# Patient Record
Sex: Female | Born: 1965 | Race: White | Hispanic: No | Smoking: Former smoker
Health system: Southern US, Community
[De-identification: ages and names within clinical notes are randomized; demographics above are authoritative.]

## PROBLEM LIST (undated history)

## (undated) DIAGNOSIS — Z8489 Family history of other specified conditions: Secondary | ICD-10-CM

## (undated) DIAGNOSIS — Z98811 Dental restoration status: Secondary | ICD-10-CM

## (undated) DIAGNOSIS — Z87442 Personal history of urinary calculi: Secondary | ICD-10-CM

## (undated) DIAGNOSIS — G43909 Migraine, unspecified, not intractable, without status migrainosus: Secondary | ICD-10-CM

## (undated) DIAGNOSIS — Z87820 Personal history of traumatic brain injury: Secondary | ICD-10-CM

## (undated) DIAGNOSIS — D171 Benign lipomatous neoplasm of skin and subcutaneous tissue of trunk: Secondary | ICD-10-CM

## (undated) DIAGNOSIS — F32A Depression, unspecified: Secondary | ICD-10-CM

## (undated) DIAGNOSIS — F329 Major depressive disorder, single episode, unspecified: Secondary | ICD-10-CM

## (undated) DIAGNOSIS — R011 Cardiac murmur, unspecified: Secondary | ICD-10-CM

---

## 1987-01-20 HISTORY — PX: WISDOM TOOTH EXTRACTION: SHX21

## 2001-03-15 ENCOUNTER — Other Ambulatory Visit: Admission: RE | Admit: 2001-03-15 | Discharge: 2001-03-15 | Payer: Self-pay | Admitting: *Deleted

## 2002-07-20 ENCOUNTER — Other Ambulatory Visit: Admission: RE | Admit: 2002-07-20 | Discharge: 2002-07-20 | Payer: Self-pay | Admitting: Family Medicine

## 2002-09-19 ENCOUNTER — Encounter: Payer: Self-pay | Admitting: Emergency Medicine

## 2002-09-19 ENCOUNTER — Emergency Department (HOSPITAL_COMMUNITY): Admission: EM | Admit: 2002-09-19 | Discharge: 2002-09-19 | Payer: Self-pay | Admitting: Emergency Medicine

## 2003-08-24 ENCOUNTER — Other Ambulatory Visit: Admission: RE | Admit: 2003-08-24 | Discharge: 2003-08-24 | Payer: Self-pay | Admitting: Family Medicine

## 2004-09-18 ENCOUNTER — Other Ambulatory Visit: Admission: RE | Admit: 2004-09-18 | Discharge: 2004-09-18 | Payer: Self-pay | Admitting: Family Medicine

## 2005-10-02 ENCOUNTER — Ambulatory Visit: Payer: Self-pay | Admitting: Sports Medicine

## 2005-11-11 ENCOUNTER — Other Ambulatory Visit: Admission: RE | Admit: 2005-11-11 | Discharge: 2005-11-11 | Payer: Self-pay | Admitting: Family Medicine

## 2006-08-17 ENCOUNTER — Ambulatory Visit: Payer: Self-pay | Admitting: Family Medicine

## 2006-08-17 DIAGNOSIS — M79609 Pain in unspecified limb: Secondary | ICD-10-CM | POA: Insufficient documentation

## 2007-01-28 ENCOUNTER — Other Ambulatory Visit: Admission: RE | Admit: 2007-01-28 | Discharge: 2007-01-28 | Payer: Self-pay | Admitting: Family Medicine

## 2008-01-30 ENCOUNTER — Other Ambulatory Visit: Admission: RE | Admit: 2008-01-30 | Discharge: 2008-01-30 | Payer: Self-pay | Admitting: Family Medicine

## 2009-05-13 ENCOUNTER — Inpatient Hospital Stay (HOSPITAL_COMMUNITY): Admission: AD | Admit: 2009-05-13 | Discharge: 2009-05-16 | Payer: Self-pay | Admitting: Obstetrics and Gynecology

## 2009-05-22 ENCOUNTER — Ambulatory Visit: Admission: RE | Admit: 2009-05-22 | Discharge: 2009-05-22 | Payer: Self-pay | Admitting: Obstetrics and Gynecology

## 2009-05-27 ENCOUNTER — Ambulatory Visit: Admission: RE | Admit: 2009-05-27 | Discharge: 2009-05-27 | Payer: Self-pay | Admitting: Obstetrics and Gynecology

## 2009-05-30 ENCOUNTER — Ambulatory Visit: Admission: RE | Admit: 2009-05-30 | Discharge: 2009-05-30 | Payer: Self-pay | Admitting: Obstetrics and Gynecology

## 2009-10-08 ENCOUNTER — Other Ambulatory Visit: Admission: RE | Admit: 2009-10-08 | Discharge: 2009-10-08 | Payer: Self-pay | Admitting: Family Medicine

## 2009-10-11 ENCOUNTER — Ambulatory Visit (HOSPITAL_COMMUNITY): Admission: RE | Admit: 2009-10-11 | Discharge: 2009-10-11 | Payer: Self-pay | Admitting: Family Medicine

## 2010-02-24 ENCOUNTER — Emergency Department (HOSPITAL_COMMUNITY): Payer: 59

## 2010-02-24 ENCOUNTER — Inpatient Hospital Stay (HOSPITAL_COMMUNITY)
Admission: EM | Admit: 2010-02-24 | Discharge: 2010-02-28 | DRG: 957 | Disposition: A | Discharge status: Home Health | Payer: 59 | Attending: General Surgery | Admitting: General Surgery

## 2010-02-24 ENCOUNTER — Inpatient Hospital Stay (HOSPITAL_COMMUNITY): Payer: 59

## 2010-02-24 DIAGNOSIS — S25109A Unspecified injury of unspecified innominate or subclavian artery, initial encounter: Secondary | ICD-10-CM | POA: Diagnosis present

## 2010-02-24 DIAGNOSIS — S42023A Displaced fracture of shaft of unspecified clavicle, initial encounter for closed fracture: Secondary | ICD-10-CM | POA: Diagnosis present

## 2010-02-24 DIAGNOSIS — Z87820 Personal history of traumatic brain injury: Secondary | ICD-10-CM

## 2010-02-24 DIAGNOSIS — S06339A Contusion and laceration of cerebrum, unspecified, with loss of consciousness of unspecified duration, initial encounter: Principal | ICD-10-CM | POA: Diagnosis present

## 2010-02-24 DIAGNOSIS — W19XXXA Unspecified fall, initial encounter: Secondary | ICD-10-CM | POA: Diagnosis present

## 2010-02-24 DIAGNOSIS — S2239XA Fracture of one rib, unspecified side, initial encounter for closed fracture: Secondary | ICD-10-CM | POA: Diagnosis present

## 2010-02-24 DIAGNOSIS — D62 Acute posthemorrhagic anemia: Secondary | ICD-10-CM | POA: Diagnosis not present

## 2010-02-24 DIAGNOSIS — S0100XA Unspecified open wound of scalp, initial encounter: Secondary | ICD-10-CM | POA: Diagnosis present

## 2010-02-24 DIAGNOSIS — J9600 Acute respiratory failure, unspecified whether with hypoxia or hypercapnia: Secondary | ICD-10-CM | POA: Diagnosis present

## 2010-02-24 HISTORY — PX: ORIF CLAVICLE FRACTURE: SUR924

## 2010-02-24 HISTORY — PX: LACERATION REPAIR: SHX5168

## 2010-02-24 HISTORY — DX: Personal history of traumatic brain injury: Z87.820

## 2010-02-24 LAB — POCT I-STAT 7, (LYTES, BLD GAS, ICA,H+H)
Calcium, Ion: 0.96 mmol/L — ABNORMAL LOW (ref 1.12–1.32)
HCT: 32 % — ABNORMAL LOW (ref 36.0–46.0)
O2 Saturation: 100 %
Patient temperature: 35.9
pCO2 arterial: 45.8 mmHg — ABNORMAL HIGH (ref 35.0–45.0)
pO2, Arterial: 500 mmHg — ABNORMAL HIGH (ref 80.0–100.0)

## 2010-02-24 LAB — BASIC METABOLIC PANEL
BUN: 7 mg/dL (ref 6–23)
CO2: 21 mEq/L (ref 19–32)
Chloride: 107 mEq/L (ref 96–112)
GFR calc non Af Amer: >60 mL/min (ref >60)
Glucose, Bld: 156 mg/dL — ABNORMAL HIGH (ref 70–99)
Potassium: 3.4 mEq/L — ABNORMAL LOW (ref 3.5–5.1)
Sodium: 138 mEq/L (ref 135–145)

## 2010-02-24 LAB — CBC
Hemoglobin: 13.4 g/dL (ref 12.0–15.0)
MCH: 29.2 pg (ref 26.0–34.0)
MCHC: 33.4 g/dL (ref 30.0–36.0)
MCV: 87.4 fL (ref 78.0–100.0)
MCV: 85.2 fL (ref 78.0–100.0)
Platelets: 293 10*3/uL (ref 150–400)
RBC: 4.31 MIL/uL (ref 3.87–5.11)
RDW: 13 % (ref 11.5–15.5)
WBC: 26 10*3/uL — ABNORMAL HIGH (ref 4.0–10.5)

## 2010-02-24 LAB — COMPREHENSIVE METABOLIC PANEL
BUN: 8 mg/dL (ref 6–23)
CO2: 18 meq/L — ABNORMAL LOW (ref 19–32)
Calcium: 8.5 mg/dL (ref 8.4–10.5)
GFR calc non Af Amer: >60 mL/min (ref >60)
Glucose, Bld: 296 mg/dL — ABNORMAL HIGH (ref 70–99)
Total Protein: 6.7 g/dL (ref 6.0–8.3)

## 2010-02-24 LAB — BLOOD GAS, ARTERIAL
Acid-base deficit: 4 mmol/L — ABNORMAL HIGH (ref 0.0–2.0)
Bicarbonate: 21.5 mEq/L (ref 20.0–24.0)
FIO2: 0.5 %
O2 Saturation: 99.2 %
PEEP: 5 cmH2O
Patient temperature: 96.8
TCO2: 23 mmol/L (ref 0–100)
pO2, Arterial: 209 mmHg — ABNORMAL HIGH (ref 80.0–100.0)

## 2010-02-24 LAB — URINALYSIS, ROUTINE W REFLEX MICROSCOPIC
Ketones, ur: NEGATIVE mg/dL
Leukocytes, UA: NEGATIVE
Nitrite: NEGATIVE
Protein, ur: 30 mg/dL — AB
Urine Glucose, Fasting: >1000 mg/dL — AB
pH: 5 (ref 5.0–8.0)

## 2010-02-24 LAB — RAPID URINE DRUG SCREEN, HOSP PERFORMED
Benzodiazepines: POSITIVE — AB
Cocaine: NOT DETECTED
Tetrahydrocannabinol: NOT DETECTED

## 2010-02-24 LAB — ABO/RH: ABO/RH(D): A POS

## 2010-02-24 LAB — PROTIME-INR
INR: 1.02 (ref 0.00–1.49)
Prothrombin Time: 13.6 s (ref 11.6–15.2)

## 2010-02-24 LAB — URINE MICROSCOPIC-ADD ON

## 2010-02-24 LAB — APTT: aPTT: 26 s (ref 24–37)

## 2010-02-24 LAB — POCT I-STAT 4, (NA,K, GLUC, HGB,HCT): Glucose, Bld: 104 mg/dL — ABNORMAL HIGH (ref 70–99)

## 2010-02-24 MED ORDER — IOHEXOL 350 MG/ML SOLN
100.0000 mL | Freq: Once | INTRAVENOUS | Status: AC | PRN
  Administered 2010-02-24: 75 mL via INTRAVENOUS

## 2010-02-24 MED ORDER — IOHEXOL 300 MG/ML  SOLN
75.0000 mL | Freq: Once | INTRAMUSCULAR | Status: AC | PRN
  Administered 2010-02-24: 75 mL via INTRAVENOUS

## 2010-02-25 ENCOUNTER — Inpatient Hospital Stay (HOSPITAL_COMMUNITY): Payer: 59

## 2010-02-25 LAB — POCT I-STAT, CHEM 8
Chloride: 109 meq/L (ref 96–112)
Creatinine, Ser: 0.9 mg/dL (ref 0.4–1.2)
Glucose, Bld: 295 mg/dL — ABNORMAL HIGH (ref 70–99)
Potassium: 3.1 meq/L — ABNORMAL LOW (ref 3.5–5.1)

## 2010-02-25 LAB — GLUCOSE, CAPILLARY
Glucose-Capillary: 290 mg/dL — ABNORMAL HIGH (ref 70–99)
Glucose-Capillary: 96 mg/dL (ref 70–99)

## 2010-02-25 LAB — C-PEPTIDE: C-Peptide: 1.56 ng/mL (ref 0.80–3.90)

## 2010-02-25 LAB — BASIC METABOLIC PANEL
BUN: 5 mg/dL — ABNORMAL LOW (ref 6–23)
CO2: 24 mEq/L (ref 19–32)
Chloride: 111 mEq/L (ref 96–112)
Creatinine, Ser: 0.7 mg/dL (ref 0.4–1.2)
Glucose, Bld: 110 mg/dL — ABNORMAL HIGH (ref 70–99)

## 2010-02-25 LAB — TYPE AND SCREEN
ABO/RH(D): A POS
Unit division: 0
Unit division: 0
Unit division: 0

## 2010-02-25 LAB — CBC
HCT: 30.7 % — ABNORMAL LOW (ref 36.0–46.0)
Hemoglobin: 10.4 g/dL — ABNORMAL LOW (ref 12.0–15.0)
MCH: 28.8 pg (ref 26.0–34.0)
MCV: 85 fL (ref 78.0–100.0)
RBC: 3.61 MIL/uL — ABNORMAL LOW (ref 3.87–5.11)

## 2010-02-26 ENCOUNTER — Inpatient Hospital Stay (HOSPITAL_COMMUNITY): Payer: 59

## 2010-02-26 LAB — GLUCOSE, CAPILLARY: Glucose-Capillary: 104 mg/dL — ABNORMAL HIGH (ref 70–99)

## 2010-02-26 LAB — CBC
HCT: 29.4 % — ABNORMAL LOW (ref 36.0–46.0)
Hemoglobin: 9.7 g/dL — ABNORMAL LOW (ref 12.0–15.0)
MCV: 87 fL (ref 78.0–100.0)
RDW: 14.1 % (ref 11.5–15.5)
WBC: 8.8 10*3/uL (ref 4.0–10.5)

## 2010-02-26 LAB — BASIC METABOLIC PANEL
BUN: 3 mg/dL — ABNORMAL LOW (ref 6–23)
Chloride: 112 mEq/L (ref 96–112)
Glucose, Bld: 98 mg/dL (ref 70–99)
Potassium: 4 mEq/L (ref 3.5–5.1)
Sodium: 139 mEq/L (ref 135–145)

## 2010-02-27 LAB — GLUCOSE, CAPILLARY: Glucose-Capillary: 101 mg/dL — ABNORMAL HIGH (ref 70–99)

## 2010-03-03 ENCOUNTER — Ambulatory Visit: Payer: 59 | Attending: Family Medicine

## 2010-03-03 DIAGNOSIS — M25519 Pain in unspecified shoulder: Secondary | ICD-10-CM | POA: Insufficient documentation

## 2010-03-03 DIAGNOSIS — S069XAS Unspecified intracranial injury with loss of consciousness status unknown, sequela: Secondary | ICD-10-CM | POA: Insufficient documentation

## 2010-03-03 DIAGNOSIS — S069X9S Unspecified intracranial injury with loss of consciousness of unspecified duration, sequela: Secondary | ICD-10-CM | POA: Insufficient documentation

## 2010-03-03 DIAGNOSIS — M6281 Muscle weakness (generalized): Secondary | ICD-10-CM | POA: Insufficient documentation

## 2010-03-03 DIAGNOSIS — W19XXXS Unspecified fall, sequela: Secondary | ICD-10-CM | POA: Insufficient documentation

## 2010-03-03 DIAGNOSIS — IMO0001 Reserved for inherently not codable concepts without codable children: Secondary | ICD-10-CM | POA: Insufficient documentation

## 2010-03-03 DIAGNOSIS — R4189 Other symptoms and signs involving cognitive functions and awareness: Secondary | ICD-10-CM | POA: Insufficient documentation

## 2010-03-03 NOTE — Op Note (Signed)
NAMEMarland Williamson  AVALIE, OCONNOR NO.:  [id number]  MEDICAL RECORD NO.:  [id number]           PATIENT TYPE:  I  LOCATION:  3106                         FACILITY:  [facility]  PHYSICIAN:  Vania Rea. Shriyans Kuenzi, M.D.  DATE OF BIRTH:  27-Apr-1965  DATE OF PROCEDURE:  02/24/2010 DATE OF DISCHARGE:                              OPERATIVE REPORT   PREOPERATIVE DIAGNOSIS:  Displaced right clavicle fracture with associated subclavian artery laceration.  POSTOPERATIVE DIAGNOSIS:  Displaced right clavicle fracture with associated subclavian artery laceration.  PROCEDURE:  Open reduction and internal fixation of displaced right clavicle fracture.  SURGEON:  Vania Rea. Zelma Snead, MD  ASSISTANT:  Lucita Lora. Shuford, PAC  ANESTHESIA:  General endotracheal.  ESTIMATED BLOOD LOSS:  During this portion of the procedure approximately 150 mL.  DRAINS:  Hemovac x1.  HISTORY:  Tamara Williamson is a 45 year old female who reportedly fell from a ground level earlier today and sustained a severe right clavicle fracture with an associated closed head injury and apparently had some changes in mental status as well as hemodynamic instability.  On further evaluation in the hospital, she was found to have significant bleeding in the right supraclavicular region with the subsequent angiogram showed an evidence for subclavian artery injury which appeared to have resulted from the displacement of her clavicle fracture at the time of the original injury.  She was brought emergently to the operating room by the Vascular Surgery Service for treatment of the subclavian artery injury and we were consulted intraoperatively for the management of the associated clavicle fracture.  Preoperatively, I had reviewed the x-rays and discussed with Dr. Tawanna Cooler Early about early management options.  Fracture pattern showed severe comminution with instability and given the degree of displacement that had occurred to allow for the  fracture and to cause a laceration of the subclavian artery, elected to proceed with open reduction and internal fixation.  PROCEDURE IN DETAIL:  An exposure had been made to the supraclavicular region by Dr. Arbie Cookey for control of the bleeding and repair of the subclavian artery.  Through this wound, we extended it laterally to allow exposure of this on the clavicle and using combination of blunt and sharp dissection, we were able to expose the clavicle over its dorsal aspect from the [facility] joint laterally to the [facility] joint medially which was necessary due to the very extensive nature of comminution and obliquity of the fracture.  The fracture sites were then exposed and under direct visualization, the major fragments were reapproximated and held with bone holding clamps.  We then took a 10-hole Acumed clavicle plate and applied this dorsally and this did show proper position and alignment with good coverage across the fracture with appropriate cortices medially and laterally and so this was selected as the plate to be used for stabilization.  We began by placing a nonlocking screw at the medial and lateral aspects of clavicle and then used fluoroscopic imaging to confirm that we liked the overall alignment, length, and position.  We then placed locking screws through the medial and lateral aspects of the plate, banding it to the area of comminution, obtaining good bony purchase.  There was such comminution over the midportion of the fracture that screws were not possible, so we basically bridge plate it across the midportion of the clavicle which had a longitudinal split between the anterior and posterior halves.  We did obtain good purchase with the screws in the medial and lateral aspect of the clavicle and this constituted the bulk of these fixation and stability.  Our final fluoroscopic images and 2 view showed the plate to be in good position and good alignment at the fracture site.  At  this point, we placed a Hemovac drain into the depth of the wound medially at the base of the neck and brought this out through the superior skin flap.  We then reapproximated the deep muscular and soft tissue planes with interrupted figure-of-eight 0 Vicryl sutures, 2-0 Vicryl through the platysma and subcu and intracuticular 3-0 Monocryl for the skin followed by Steri- Strips.  Dry dressing was applied.  Right arm was placed in a sling and at this point, the patient was then transferred back to the Neurosurgical Intensive Care Unit in stable condition.     Vania Rea. Geroldine Esquivias, M.D.     KMS/MEDQ  D:  02/24/2010  T:  02/25/2010  Job:  [id number]  Electronically Signed by Francena Hanly M.D. on 03/03/2010 12:05:02 PM

## 2010-03-03 NOTE — Op Note (Addendum)
NAMEMarland Williamson  JACQUELEEN, Williamson NO.:  [id number]  MEDICAL RECORD NO.:  [id number]           PATIENT TYPE:  I  LOCATION:  3106                         FACILITY:  [facility]  PHYSICIAN:  Larina Earthly, M.D.    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  PROCEDURE:  Exploration of right subclavian artery and primary repair of laceration.  SURGEON:  Larina Earthly, MD  ASSISTANT:  Zenaida Niece, RNFA  ANESTHESIA:  General endotracheal.  COMPLICATIONS:  None.  DISPOSITION:  The patient will have a plating of her clavicle which be dictated as a separate note following this artery repair.  INDICATIONS FOR PROCEDURE:  Tamara Williamson is a otherwise healthy 45- year-old female who had a ground-level fall.  She had multiple injuries related to this with closed head injury and also rib fractures on the right and right clavicle fracture.  CT for evaluation revealed large hematoma in her right neck extending at the apex of her lung was not intrapleural.  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  Ms. Vinciguerra was intubated on my initial exam.  She did have a palpable radial pulse.  It was felt that she needed emergent repair of her unstable artery bleed with active extravasation.  I discussed this at length with her husband and mother present.  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  The injury was just distal to the right vertebral artery.  She was then taken emergently to the operating room. She did have a closed head injury  as well and I discussed this with Dr. Barnett Abu, the consulting her surgeon who felt that temporary heparinization and reversal with protamine would be acceptable.  PROCEDURE IN DETAIL:  The patient was taken to operating room, placed in supine position where the area of the right neck and sternal area were prepped and draped in usual sterile fashion.  Her arms were tucked bilaterally.  A supraclavicular incision was made and there was extensive hematoma throughout this area.  Dissection was carried down through the platysma and the clavicular head of the sternocleidomastoid muscle was divided.  The patient had a great deal of hematoma in this area but no frank bleeding.  The anterior scalene muscle was divided. The phrenic nerve was identified and preserved.  The subclavian artery distal to the injury was isolated.  Dissection was then continued proximally and the subclavian artery proximal to the injury was also identified.  This was at the level of the vertebral artery.  Branches of the thyrocervical trunk and internal mammary artery were controlled with red vessel loops.  On continued mobilization, the thrombus that was holding the hemorrhage was dislodged and there was arterial bleeding from this.  This was easily controlled with finger.  The patient was  given 5000 units of intravenous heparin and after adequate circulation time, the subclavian artery was occluded proximally.  This was just distal to the takeoff of the right vertebral artery.  The distal subclavian artery was also occluded with a angled De Bakey clamp.  The thrombus was removed.  There was a laceration on the anterior wall of the subclavian artery only.  This was relatively clean laceration. There was no evidence of injury to the back wall of the subclavian artery.  The artery was mobilized sufficiently that primary closure would be possible without narrowing the artery and without placing this under undue  tension.  For this reason, the laceration of the artery was closed with interrupted 5-0 Prolene sutures.  Prior to completion of this anastomosis, the artery was flushed and no thrombus was noted.  The anastomosis was completed and clamps removed.  There was excellent Doppler flow in the right vertebral artery and distally.  The patient was given 50 mg of protamine to reverse the heparin.  Dr. Rennis Chris then began repair and plating of the clavicle fracture.  On further mobilization of this level, there was a branch off the subclavian vein that was bleeding.  This was controlled with Ligaclips.  The plating and the clavicle, and closure will be dictated as a separate note by Dr. Francena Hanly.     Larina Earthly, M.D.     TFE/MEDQ  D:  02/24/2010  T:  02/25/2010  Job:  [id number]  Electronically Signed by Kween Bacorn M.D. on 03/03/2010 07:41:45 PM

## 2010-03-05 ENCOUNTER — Ambulatory Visit: Payer: 59 | Admitting: Physical Therapy

## 2010-03-05 ENCOUNTER — Encounter: Payer: Self-pay | Admitting: Physical Therapy

## 2010-03-05 ENCOUNTER — Other Ambulatory Visit: Payer: Self-pay | Admitting: Physical Therapy

## 2010-03-05 ENCOUNTER — Ambulatory Visit: Payer: 59 | Admitting: Occupational Therapy

## 2010-03-05 ENCOUNTER — Encounter: Payer: Self-pay | Admitting: Occupational Therapy

## 2010-03-05 LAB — SULFONYLUREA HYPOGLYCEMICS PANEL, URINE

## 2010-03-10 ENCOUNTER — Ambulatory Visit: Payer: 59

## 2010-03-10 ENCOUNTER — Ambulatory Visit: Payer: 59 | Admitting: Physical Therapy

## 2010-03-11 ENCOUNTER — Ambulatory Visit: Payer: 59 | Admitting: Occupational Therapy

## 2010-03-12 ENCOUNTER — Ambulatory Visit: Payer: 59 | Admitting: Physical Therapy

## 2010-03-12 ENCOUNTER — Ambulatory Visit: Payer: 59

## 2010-03-12 NOTE — Consult Note (Signed)
NAMEMarland Kitchen  KYLIN, Tamara Williamson  MEDICAL RECORD NO.:  [id number]           PATIENT TYPE:  I  LOCATION:  3106                         FACILITY:  [facility]  PHYSICIAN:  Antony Contras, MD     DATE OF BIRTH:  [birth year]  DATE OF CONSULTATION:  02/24/2010 DATE OF DISCHARGE:                                CONSULTATION   REQUESTING SERVICE:  Trauma Surgery.  CHIEF COMPLAINT:  Facial fracture.  HISTORY OF PRESENT ILLNESS:  The patient is a 45 year old white female who evidently was shopping at [facility] earlier today and fell to the ground for unknown reasons.  She was apparently found by a Theatre stage manager who could not feel the pulse and so CPR was initiated.  She was brought to the emergency department where she was intubated because she was combative.  She had vomitus around her nose and mouth upon arrival. Intubation was uneventful.  Evaluation has revealed a right clavicle fracture and rib fracture.  It seems the clavicle fracture has caused the subclavian artery tear, which is to be repaired shortly by Vascular Surgery.  CT imaging of the head was also performed with findings noted below.  On the head CT, a right-sided zygomatic fracture was noted and consultation was requested.  PAST MEDICAL HISTORY:  Diabetes.  MEDICATIONS:  Unknown.  ALLERGIES:  Unknown.  FAMILY HISTORY:  Unknown.  SOCIAL HISTORY:  Nondrinker, nonsmoker.  REVIEW OF SYSTEMS:  Unable to be obtained due to mental status.  PHYSICAL EXAMINATION:  VITAL SIGNS:  Afebrile.  Vital signs stable. GENERAL:  The patient is intubated and sedated in the intensive care unit and not responsive. EYES:  There are no orbital step-offs or deformities.  Extraocular movements cannot be assessed.  Pupils are equal. NOSE:  External nose is normal.  Nasal passages are patent with relative midline septum. HEAD AND FACE:  There is a laceration of the right scalp with staples. There is no other deformity of  the face noted on palpation with no edema, ecchymosis, or bony step-offs palpable. ORAL CAVITY/OROPHARYNX:  Exam limited by the endotracheal tube. EARS:  External ears are normal and the external canals are patent. Tympanic membranes are intact.  Middle ear spaces are aerated.  There is no hemotympanum. NECK:  No deformity or ecchymosis noted. LYMPHATICS:  There are no enlarged lymph nodes in the neck. THYROID:  Normal to palpation. SALIVARY GLANDS:  Normal to palpation. NEUROLOGIC:  Cranial nerves II-XII cannot be assessed.  RADIOLOGIC EXAM:  Head CT:  Personally reviewed.  Demonstrates a nondisplaced right zygomatic arch fracture, but no other obvious facial fracture on the images seen, although facial bones are not fully imaged. There is a small area of pneumocephalus in the left temporal region just above a well-aerated petrous temporal bone.  There is no obvious fracture seen on the head CT in this location.  There is associated brain contusion.  ASSESSMENT:  The patient is a 45 year old white female with a nondisplaced right zygomatic arch fracture.  PLAN:  The patient is going to be taken to the operating room shortly for vascular surgery.  I have discussed the case with  Trauma Surgery. He plans to perform a facial CT tomorrow after the patient is stabilized and this is reasonable to me.  The face does not appear to have deformity by exam and this fracture may be old, but better assessment will be by the facial CT scan.  Regarding the temporal bone, an obvious fracture is not seen on head CT scan but may become more apparent on additional imaging.  Neurosurgery is involved with her care.     Antony Contras, MD     DDB/MEDQ  D:  02/24/2010  T:  02/25/2010  Job:  [id number]  Electronically Signed by Christia Reading MD on 03/12/2010 06:01:30 PM

## 2010-03-13 ENCOUNTER — Ambulatory Visit: Payer: 59 | Admitting: Occupational Therapy

## 2010-03-13 NOTE — Consult Note (Signed)
  NAMEMarland Kitchen  EMONII, WIENKE NO.:  [id number]  MEDICAL RECORD NO.:  [id number]           PATIENT TYPE:  I  LOCATION:  3106                         FACILITY:  [facility]  PHYSICIAN:  Stefani Dama, M.D.  DATE OF BIRTH:  Sep 13, 1965  DATE OF CONSULTATION:  02/24/2010 DATE OF DISCHARGE:                                CONSULTATION   REQUESTOR:  Cherylynn Ridges, MD  REASON FOR REQUEST:  Closed head injury.  HISTORY OF PRESENT ILLNESS:  Tamara Williamson is a 45 year old right- handed individual who apparently sustained a fall while at [facility] today. The patient apparently fell from standing height, striking the right parietal region.  She sustained a small laceration there.  She also had fractured a clavicle and some ribs on that right side.  The patient was initially noted to have a loss of consciousness when she was seen in the emergency room.  She was inconsolable and incoherent and required intubation.  It was also noted that she had developed a substantial hematoma in her right supraclavicular region.  CT scan of the brain demonstrated presence of a left temporal contusion and a small area of pneumocephalus just above the petrous bone.  No distinct fracture was noted in the petrous bone, however.  Considering the initial impact, this appeared to be a contrecoup type of injury.  CT scan of the C-spine was performed which showed no evidence of any fractures.  The patient is undergoing vascular evaluation for a possible injury to the subclavian artery.  PAST MEDICAL HISTORY:  Not known at this time.  Social history, systems review is not known.  PHYSICAL EXAMINATION:  GENERAL:  The patient is intubated. HEENT:  Pupils are 6 mm and briskly reactive to light and accommodation. She is being sedated with propofol. EXTREMITIES:  The patient describes no movement of any of the extremities at this time.  IMPRESSION:  The patient has evidence of a left temporal  intracerebral contusion.  This needs simple observation at the current time.  An ICP monitor is not deemed to be necessary as this injury appears to be very focal.  We will continue to monitor this patient with a repeat CT scan in the next 24 hours.  I did have a brief discussion with Dr. Tawanna Cooler Early who noted that he is concerned that the patient has a subclavian artery injury and this will likely need to undergo surgical exploration and repair and that the patient may need to receive heparin during the time of the repair.  I believe that this is a safe consideration for her even in light of the presence of the head injury.  I will be following up with the patient in the next day or so.     Stefani Dama, M.D.     Merla Riches  D:  02/24/2010  T:  02/25/2010  Job:  [id number]  Electronically Signed by Barnett Abu M.D. on 03/13/2010 08:04:36 AM

## 2010-03-16 NOTE — Discharge Summary (Signed)
Tamara Williamson, SZUCH NO.:  [id number]  MEDICAL RECORD NO.:  [id number]           PATIENT TYPE:  I  LOCATION:  3013                         FACILITY:  [facility]  PHYSICIAN:  Cherylynn Ridges, M.D.    DATE OF BIRTH:  1965-07-26  DATE OF ADMISSION:  02/24/2010 DATE OF DISCHARGE:  02/28/2010                              DISCHARGE SUMMARY   DISCHARGE DIAGNOSES: 1. Fall. 2. Traumatic brain injury and cerebral contusion. 3. Right clavicle fracture. 4. Right rib fracture. 5. Ventilator-dependent respiratory failure. 6. Acute blood loss anemia. 7. Idiopathic hypoglycemia. 8. Syncope. 9. Migraine. 10.Right subclavian artery injury. 11.Scalp laceration.  CONSULTANTS: 1. Vania Rea. Supple, MD for Orthopedic Surgery. 2. Larina Earthly, MD for Vascular Surgery. 3. Antony Contras, MD for Facial Surgery.  PROCEDURES: 1. Repair of scalp laceration by Dr. Janee Morn. 2. ORIF of clavicular fracture by Dr. Rennis Chris. 3. Subclavian artery exploration and repair by Dr. Arbie Cookey.  HISTORY OF PRESENT ILLNESS:  This is a 45 year old white female who was in [place] when she began feeling bad.  Her vision seemed to be going dark.  According to witnesses just before her syncopal event, she seemed to be hallucinating.  She then became rigid and fell, striking her head on the right side.  An off duty firefighter assessed her, found her to be pulseless and started CPR.  This happened for about 30 seconds, at which point the patient woke up and was combative.  When EMS arrived on the scene, she had an initial CBG of 55.  She received some D50 and a repeat CBG was undetectable.  Following administration of glucagon in several minutes, repeat CBG was over 200.  She was brought in as level I trauma.  She was screaming phrases at the top of her lungs, but would not follow commands nor interact with anybody.  Because of some traumatic right neck swelling and her combativeness, she was intubated. Workup  showed the above-mentioned injuries.  She was taken emergently to the operating room where she had her clavicle and subclavian artery injuries repaired.  She was then transferred to the intensive care unit.  HOSPITAL COURSE:  The patient was able to be extubated the following morning without difficulty.  She did have evidence of a right zygomatic arch injury and a facial CT was performed.  Otolaryngologist evaluated the patient and thought that the fracture was old.  She had a lot of problems with nausea and vomiting initially which was partially helped by Zofran.  However, this eventually cleared up on its own.  She also had some dizziness when ambulating and Physical and Occupational Therapy were consulted.  Workup for causes of her hypoglycemia did not turn up anything identifiable.  When she was able to keep liquids down, she was able to be discharged in improved condition home in the care of her husband.  DISCHARGE MEDICATIONS: 1. Norco 5/325, take 1-2 p.o. q.4 hours p.r.n. pain, #30 with no     refill. 2. In addition, she is to resume her home medication of albuterol to     use for shortness of breath or wheezing.  FOLLOWUP:  The patient will need to follow up in the Trauma Clinic on Thursday for staple removal.  She will need to make appointments to see Dr. Danielle Dess, Dr. Rennis Chris, and Dr. Arbie Cookey.  I have advised a followup with her primary care physician within 6 weeks in case they feel any further workup that needs to be done.  If she has any questions or concerns, she is to call.     Earney Hamburg, P.A.   ______________________________ Cherylynn Ridges, M.D.    MJ/MEDQ  D:  02/28/2010  T:  02/28/2010  Job:  [id number]  cc:   Vania Rea. Supple, M.D. Larina Earthly, M.D. Antony Contras, MD Pam Drown, M.D.  Electronically Signed by Charma Igo P.A. on 03/10/2010 03:45:16 PM Electronically Signed by Jimmye Norman M.D. on 03/16/2010 07:30:41 AM

## 2010-03-18 ENCOUNTER — Ambulatory Visit: Payer: 59 | Admitting: Vascular Surgery

## 2010-03-19 ENCOUNTER — Ambulatory Visit: Payer: 59 | Admitting: Physical Therapy

## 2010-03-19 ENCOUNTER — Ambulatory Visit: Payer: 59

## 2010-03-19 ENCOUNTER — Ambulatory Visit: Payer: 59 | Admitting: Occupational Therapy

## 2010-03-20 ENCOUNTER — Ambulatory Visit: Payer: 59 | Attending: Family Medicine

## 2010-03-20 DIAGNOSIS — R4189 Other symptoms and signs involving cognitive functions and awareness: Secondary | ICD-10-CM | POA: Insufficient documentation

## 2010-03-20 DIAGNOSIS — W19XXXS Unspecified fall, sequela: Secondary | ICD-10-CM | POA: Insufficient documentation

## 2010-03-20 DIAGNOSIS — M25519 Pain in unspecified shoulder: Secondary | ICD-10-CM | POA: Insufficient documentation

## 2010-03-20 DIAGNOSIS — S069XAS Unspecified intracranial injury with loss of consciousness status unknown, sequela: Secondary | ICD-10-CM | POA: Insufficient documentation

## 2010-03-20 DIAGNOSIS — M6281 Muscle weakness (generalized): Secondary | ICD-10-CM | POA: Insufficient documentation

## 2010-03-20 DIAGNOSIS — IMO0001 Reserved for inherently not codable concepts without codable children: Secondary | ICD-10-CM | POA: Insufficient documentation

## 2010-03-20 DIAGNOSIS — S069X9S Unspecified intracranial injury with loss of consciousness of unspecified duration, sequela: Secondary | ICD-10-CM | POA: Insufficient documentation

## 2010-03-21 ENCOUNTER — Ambulatory Visit: Payer: 59 | Admitting: Occupational Therapy

## 2010-03-21 ENCOUNTER — Ambulatory Visit: Payer: 59 | Admitting: Physical Therapy

## 2010-03-25 ENCOUNTER — Encounter: Payer: 59 | Admitting: Occupational Therapy

## 2010-03-25 ENCOUNTER — Ambulatory Visit: Payer: 59 | Admitting: Physical Therapy

## 2010-03-25 ENCOUNTER — Ambulatory Visit (INDEPENDENT_AMBULATORY_CARE_PROVIDER_SITE_OTHER): Payer: 59 | Admitting: Vascular Surgery

## 2010-03-25 ENCOUNTER — Ambulatory Visit: Payer: Self-pay | Admitting: Vascular Surgery

## 2010-03-25 DIAGNOSIS — S25109A Unspecified injury of unspecified innominate or subclavian artery, initial encounter: Secondary | ICD-10-CM

## 2010-03-26 ENCOUNTER — Ambulatory Visit: Payer: 59

## 2010-03-26 ENCOUNTER — Encounter: Payer: 59 | Admitting: Occupational Therapy

## 2010-03-26 ENCOUNTER — Ambulatory Visit: Payer: 59 | Admitting: Physical Therapy

## 2010-03-26 NOTE — Assessment & Plan Note (Signed)
OFFICE VISIT  Tamara Williamson, Tamara Williamson DOB:  [birth year]                                       03/25/2010 ZOXWR#:[id number]  This patient presents today for follow-up of her subclavian injury, a very unusual situation in that she had a ground level fall on 02/24/2010.  She had a comminuted fracture of the right clavicle actually causing laceration of the subclavian artery with a large hematoma in her right neck extending into the apex of her pleural space. She underwent exploration and primary repair of her right subclavian artery and had plating of her clavicle at the same operation with Dr. Francena Hanly.  She did well and was discharged from hospital.  She is here today for follow-up.  She looks quite good.  She has a normal 2+ radial pulse on the right.  Her clavicular incision is healing quite nicely.  She does have fullness related to the clavicular fracture.  She does not have a carotid or subclavian bruit.  I discussed the long-term expectations with the patient.  I do not feel that she needs any ongoing follow-up.  I explained it would be very unlikely for her to have any evidence of stenosis or occlusive problems related to the primary repair of her subclavian artery.  I did explain that if it did occlude that she would have fatigue in her arm with exercise.  She will notify us should she develop any concerns, otherwise we will see her again on an as-needed basis.  She is to follow up with Dr. Francena Hanly again later this month to determine her exercise tolerance.  She has no restrictions from an arterial standpoint.    Larina Earthly, M.D. Electronically Signed  TFE/MEDQ  D:  03/25/2010  T:  03/26/2010  Job:  [id number]  cc:   Tamara Williamson, M.D. Tamara Williamson, M.D.

## 2010-03-31 ENCOUNTER — Encounter: Payer: 59 | Admitting: Occupational Therapy

## 2010-03-31 ENCOUNTER — Ambulatory Visit: Payer: 59 | Admitting: Physical Therapy

## 2010-03-31 ENCOUNTER — Ambulatory Visit: Payer: 59

## 2010-04-03 ENCOUNTER — Encounter: Payer: 59 | Admitting: Occupational Therapy

## 2010-04-03 ENCOUNTER — Ambulatory Visit: Payer: 59 | Admitting: Physical Therapy

## 2010-04-07 ENCOUNTER — Ambulatory Visit: Payer: 59 | Admitting: Physical Therapy

## 2010-04-07 ENCOUNTER — Encounter: Payer: 59 | Admitting: Occupational Therapy

## 2010-04-08 LAB — CBC
HCT: 29.3 % — ABNORMAL LOW (ref 36.0–46.0)
HCT: 33 % — ABNORMAL LOW (ref 36.0–46.0)
Hemoglobin: 10.8 g/dL — ABNORMAL LOW (ref 12.0–15.0)
MCHC: 32.7 g/dL (ref 30.0–36.0)
MCV: 78.4 fL (ref 78.0–100.0)
Platelets: 165 10*3/uL (ref 150–400)
Platelets: 187 10*3/uL (ref 150–400)
RBC: 4.2 MIL/uL (ref 3.87–5.11)
RDW: 14.9 % (ref 11.5–15.5)
RDW: 15.1 % (ref 11.5–15.5)
WBC: 8.8 10*3/uL (ref 4.0–10.5)

## 2010-04-08 LAB — CCBB MATERNAL DONOR DRAW

## 2010-04-08 LAB — RPR: RPR Ser Ql: NONREACTIVE

## 2010-04-09 ENCOUNTER — Ambulatory Visit: Payer: 59

## 2010-04-09 ENCOUNTER — Encounter: Payer: 59 | Admitting: Occupational Therapy

## 2010-04-09 ENCOUNTER — Ambulatory Visit: Payer: 59 | Admitting: Physical Therapy

## 2010-04-14 ENCOUNTER — Encounter: Payer: 59 | Admitting: Occupational Therapy

## 2010-04-16 ENCOUNTER — Encounter: Payer: 59 | Admitting: Occupational Therapy

## 2010-04-21 ENCOUNTER — Encounter: Payer: 59 | Admitting: Occupational Therapy

## 2010-04-23 ENCOUNTER — Encounter: Payer: 59 | Admitting: Occupational Therapy

## 2010-05-13 ENCOUNTER — Encounter: Payer: 59 | Attending: Family Medicine | Admitting: Dietician

## 2010-05-13 DIAGNOSIS — E162 Hypoglycemia, unspecified: Secondary | ICD-10-CM | POA: Insufficient documentation

## 2010-05-13 DIAGNOSIS — Z713 Dietary counseling and surveillance: Secondary | ICD-10-CM | POA: Insufficient documentation

## 2011-11-10 ENCOUNTER — Other Ambulatory Visit (HOSPITAL_COMMUNITY): Payer: Self-pay | Admitting: Family Medicine

## 2011-11-10 DIAGNOSIS — Z1231 Encounter for screening mammogram for malignant neoplasm of breast: Secondary | ICD-10-CM

## 2011-11-27 ENCOUNTER — Ambulatory Visit (HOSPITAL_COMMUNITY)
Admission: RE | Admit: 2011-11-27 | Discharge: 2011-11-27 | Disposition: A | Discharge status: Home / Self Care | Payer: 59 | Source: Ambulatory Visit | Attending: Family Medicine | Admitting: Family Medicine

## 2011-11-27 DIAGNOSIS — Z1231 Encounter for screening mammogram for malignant neoplasm of breast: Secondary | ICD-10-CM | POA: Insufficient documentation

## 2012-07-20 ENCOUNTER — Other Ambulatory Visit (HOSPITAL_COMMUNITY)
Admission: RE | Admit: 2012-07-20 | Discharge: 2012-07-20 | Disposition: A | Discharge status: Home / Self Care | Payer: BC Managed Care – PPO | Source: Ambulatory Visit | Attending: Family Medicine | Admitting: Family Medicine

## 2012-07-20 ENCOUNTER — Other Ambulatory Visit: Payer: Self-pay | Admitting: Family Medicine

## 2012-07-20 DIAGNOSIS — Z0000 Encounter for general adult medical examination without abnormal findings: Secondary | ICD-10-CM | POA: Insufficient documentation

## 2012-07-20 DIAGNOSIS — Z1151 Encounter for screening for human papillomavirus (HPV): Secondary | ICD-10-CM | POA: Insufficient documentation

## 2015-08-01 DIAGNOSIS — F410 Panic disorder [episodic paroxysmal anxiety] without agoraphobia: Secondary | ICD-10-CM | POA: Diagnosis not present

## 2015-08-01 DIAGNOSIS — G479 Sleep disorder, unspecified: Secondary | ICD-10-CM | POA: Diagnosis not present

## 2015-08-01 DIAGNOSIS — F419 Anxiety disorder, unspecified: Secondary | ICD-10-CM | POA: Diagnosis not present

## 2015-08-01 DIAGNOSIS — Z0000 Encounter for general adult medical examination without abnormal findings: Secondary | ICD-10-CM | POA: Diagnosis not present

## 2015-08-03 ENCOUNTER — Other Ambulatory Visit: Payer: Self-pay | Admitting: Family Medicine

## 2015-08-03 DIAGNOSIS — Z1231 Encounter for screening mammogram for malignant neoplasm of breast: Secondary | ICD-10-CM

## 2015-08-05 DIAGNOSIS — F4320 Adjustment disorder, unspecified: Secondary | ICD-10-CM | POA: Diagnosis not present

## 2015-08-12 DIAGNOSIS — F4320 Adjustment disorder, unspecified: Secondary | ICD-10-CM | POA: Diagnosis not present

## 2015-08-27 DIAGNOSIS — F4320 Adjustment disorder, unspecified: Secondary | ICD-10-CM | POA: Diagnosis not present

## 2015-09-03 DIAGNOSIS — F4320 Adjustment disorder, unspecified: Secondary | ICD-10-CM | POA: Diagnosis not present

## 2015-09-17 DIAGNOSIS — F4320 Adjustment disorder, unspecified: Secondary | ICD-10-CM | POA: Diagnosis not present

## 2015-10-01 DIAGNOSIS — F4320 Adjustment disorder, unspecified: Secondary | ICD-10-CM | POA: Diagnosis not present

## 2015-10-08 DIAGNOSIS — F4320 Adjustment disorder, unspecified: Secondary | ICD-10-CM | POA: Diagnosis not present

## 2015-10-29 DIAGNOSIS — F4320 Adjustment disorder, unspecified: Secondary | ICD-10-CM | POA: Diagnosis not present

## 2015-10-30 ENCOUNTER — Ambulatory Visit
Admission: RE | Admit: 2015-10-30 | Discharge: 2015-10-30 | Disposition: A | Discharge status: Home / Self Care | Payer: BLUE CROSS/BLUE SHIELD | Source: Ambulatory Visit | Attending: Family Medicine | Admitting: Family Medicine

## 2015-10-30 DIAGNOSIS — Z1231 Encounter for screening mammogram for malignant neoplasm of breast: Secondary | ICD-10-CM

## 2015-11-04 ENCOUNTER — Other Ambulatory Visit: Payer: Self-pay | Admitting: Family Medicine

## 2015-11-04 DIAGNOSIS — R928 Other abnormal and inconclusive findings on diagnostic imaging of breast: Secondary | ICD-10-CM

## 2015-11-08 ENCOUNTER — Ambulatory Visit
Admission: RE | Admit: 2015-11-08 | Discharge: 2015-11-08 | Disposition: A | Discharge status: Home / Self Care | Payer: BLUE CROSS/BLUE SHIELD | Source: Ambulatory Visit | Attending: Family Medicine | Admitting: Family Medicine

## 2015-11-08 DIAGNOSIS — R928 Other abnormal and inconclusive findings on diagnostic imaging of breast: Secondary | ICD-10-CM

## 2015-11-08 DIAGNOSIS — N6323 Unspecified lump in the left breast, lower outer quadrant: Secondary | ICD-10-CM | POA: Diagnosis not present

## 2015-11-12 DIAGNOSIS — F4320 Adjustment disorder, unspecified: Secondary | ICD-10-CM | POA: Diagnosis not present

## 2015-11-19 DIAGNOSIS — F4320 Adjustment disorder, unspecified: Secondary | ICD-10-CM | POA: Diagnosis not present

## 2015-11-26 DIAGNOSIS — F4320 Adjustment disorder, unspecified: Secondary | ICD-10-CM | POA: Diagnosis not present

## 2015-12-10 DIAGNOSIS — F4320 Adjustment disorder, unspecified: Secondary | ICD-10-CM | POA: Diagnosis not present

## 2015-12-17 DIAGNOSIS — F4320 Adjustment disorder, unspecified: Secondary | ICD-10-CM | POA: Diagnosis not present

## 2016-01-07 DIAGNOSIS — F4320 Adjustment disorder, unspecified: Secondary | ICD-10-CM | POA: Diagnosis not present

## 2016-01-21 DIAGNOSIS — F4320 Adjustment disorder, unspecified: Secondary | ICD-10-CM | POA: Diagnosis not present

## 2016-02-04 DIAGNOSIS — F4320 Adjustment disorder, unspecified: Secondary | ICD-10-CM | POA: Diagnosis not present

## 2016-02-11 DIAGNOSIS — F4320 Adjustment disorder, unspecified: Secondary | ICD-10-CM | POA: Diagnosis not present

## 2016-03-03 DIAGNOSIS — F4320 Adjustment disorder, unspecified: Secondary | ICD-10-CM | POA: Diagnosis not present

## 2016-03-10 DIAGNOSIS — F4320 Adjustment disorder, unspecified: Secondary | ICD-10-CM | POA: Diagnosis not present

## 2016-03-17 DIAGNOSIS — F4320 Adjustment disorder, unspecified: Secondary | ICD-10-CM | POA: Diagnosis not present

## 2016-03-24 DIAGNOSIS — F4320 Adjustment disorder, unspecified: Secondary | ICD-10-CM | POA: Diagnosis not present

## 2016-04-07 DIAGNOSIS — F4320 Adjustment disorder, unspecified: Secondary | ICD-10-CM | POA: Diagnosis not present

## 2016-04-21 DIAGNOSIS — F4320 Adjustment disorder, unspecified: Secondary | ICD-10-CM | POA: Diagnosis not present

## 2016-04-28 DIAGNOSIS — F4320 Adjustment disorder, unspecified: Secondary | ICD-10-CM | POA: Diagnosis not present

## 2016-05-07 DIAGNOSIS — J209 Acute bronchitis, unspecified: Secondary | ICD-10-CM | POA: Diagnosis not present

## 2016-05-07 DIAGNOSIS — J3081 Allergic rhinitis due to animal (cat) (dog) hair and dander: Secondary | ICD-10-CM | POA: Diagnosis not present

## 2016-05-12 DIAGNOSIS — F4320 Adjustment disorder, unspecified: Secondary | ICD-10-CM | POA: Diagnosis not present

## 2016-05-19 DIAGNOSIS — F4320 Adjustment disorder, unspecified: Secondary | ICD-10-CM | POA: Diagnosis not present

## 2016-05-27 DIAGNOSIS — F4320 Adjustment disorder, unspecified: Secondary | ICD-10-CM | POA: Diagnosis not present

## 2016-06-02 DIAGNOSIS — F4320 Adjustment disorder, unspecified: Secondary | ICD-10-CM | POA: Diagnosis not present

## 2016-06-09 DIAGNOSIS — F4320 Adjustment disorder, unspecified: Secondary | ICD-10-CM | POA: Diagnosis not present

## 2016-06-16 DIAGNOSIS — F4320 Adjustment disorder, unspecified: Secondary | ICD-10-CM | POA: Diagnosis not present

## 2016-06-23 DIAGNOSIS — F4320 Adjustment disorder, unspecified: Secondary | ICD-10-CM | POA: Diagnosis not present

## 2016-06-30 DIAGNOSIS — F4320 Adjustment disorder, unspecified: Secondary | ICD-10-CM | POA: Diagnosis not present

## 2016-07-07 DIAGNOSIS — F4320 Adjustment disorder, unspecified: Secondary | ICD-10-CM | POA: Diagnosis not present

## 2016-07-21 DIAGNOSIS — F4320 Adjustment disorder, unspecified: Secondary | ICD-10-CM | POA: Diagnosis not present

## 2016-07-28 DIAGNOSIS — F4320 Adjustment disorder, unspecified: Secondary | ICD-10-CM | POA: Diagnosis not present

## 2016-08-04 DIAGNOSIS — F4320 Adjustment disorder, unspecified: Secondary | ICD-10-CM | POA: Diagnosis not present

## 2016-08-11 DIAGNOSIS — F4320 Adjustment disorder, unspecified: Secondary | ICD-10-CM | POA: Diagnosis not present

## 2016-08-18 DIAGNOSIS — F4320 Adjustment disorder, unspecified: Secondary | ICD-10-CM | POA: Diagnosis not present

## 2016-08-19 DIAGNOSIS — Z131 Encounter for screening for diabetes mellitus: Secondary | ICD-10-CM | POA: Diagnosis not present

## 2016-08-19 DIAGNOSIS — Z13220 Encounter for screening for lipoid disorders: Secondary | ICD-10-CM | POA: Diagnosis not present

## 2016-08-19 DIAGNOSIS — Z0000 Encounter for general adult medical examination without abnormal findings: Secondary | ICD-10-CM | POA: Diagnosis not present

## 2016-08-19 DIAGNOSIS — F419 Anxiety disorder, unspecified: Secondary | ICD-10-CM | POA: Diagnosis not present

## 2016-08-19 DIAGNOSIS — G479 Sleep disorder, unspecified: Secondary | ICD-10-CM | POA: Diagnosis not present

## 2016-08-25 DIAGNOSIS — F4320 Adjustment disorder, unspecified: Secondary | ICD-10-CM | POA: Diagnosis not present

## 2016-09-15 DIAGNOSIS — F4320 Adjustment disorder, unspecified: Secondary | ICD-10-CM | POA: Diagnosis not present

## 2016-09-23 DIAGNOSIS — F4320 Adjustment disorder, unspecified: Secondary | ICD-10-CM | POA: Diagnosis not present

## 2016-09-29 DIAGNOSIS — F4320 Adjustment disorder, unspecified: Secondary | ICD-10-CM | POA: Diagnosis not present

## 2016-10-06 DIAGNOSIS — F4320 Adjustment disorder, unspecified: Secondary | ICD-10-CM | POA: Diagnosis not present

## 2016-10-13 DIAGNOSIS — F4320 Adjustment disorder, unspecified: Secondary | ICD-10-CM | POA: Diagnosis not present

## 2016-10-20 DIAGNOSIS — F4320 Adjustment disorder, unspecified: Secondary | ICD-10-CM | POA: Diagnosis not present

## 2016-11-10 DIAGNOSIS — F4320 Adjustment disorder, unspecified: Secondary | ICD-10-CM | POA: Diagnosis not present

## 2016-12-01 DIAGNOSIS — F4320 Adjustment disorder, unspecified: Secondary | ICD-10-CM | POA: Diagnosis not present

## 2016-12-08 DIAGNOSIS — F4320 Adjustment disorder, unspecified: Secondary | ICD-10-CM | POA: Diagnosis not present

## 2016-12-22 DIAGNOSIS — F4320 Adjustment disorder, unspecified: Secondary | ICD-10-CM | POA: Diagnosis not present

## 2017-01-05 DIAGNOSIS — F4320 Adjustment disorder, unspecified: Secondary | ICD-10-CM | POA: Diagnosis not present

## 2017-01-26 DIAGNOSIS — F4320 Adjustment disorder, unspecified: Secondary | ICD-10-CM | POA: Diagnosis not present

## 2017-01-26 DIAGNOSIS — L729 Follicular cyst of the skin and subcutaneous tissue, unspecified: Secondary | ICD-10-CM | POA: Diagnosis not present

## 2017-01-27 ENCOUNTER — Emergency Department (HOSPITAL_BASED_OUTPATIENT_CLINIC_OR_DEPARTMENT_OTHER): Payer: BLUE CROSS/BLUE SHIELD

## 2017-01-27 ENCOUNTER — Observation Stay (HOSPITAL_BASED_OUTPATIENT_CLINIC_OR_DEPARTMENT_OTHER)
Admission: EM | Admit: 2017-01-27 | Discharge: 2017-01-28 | Disposition: A | Discharge status: Home / Self Care | Payer: BLUE CROSS/BLUE SHIELD | Attending: Internal Medicine | Admitting: Internal Medicine

## 2017-01-27 ENCOUNTER — Other Ambulatory Visit: Payer: Self-pay

## 2017-01-27 ENCOUNTER — Encounter (HOSPITAL_BASED_OUTPATIENT_CLINIC_OR_DEPARTMENT_OTHER): Payer: Self-pay | Admitting: Emergency Medicine

## 2017-01-27 DIAGNOSIS — Z79899 Other long term (current) drug therapy: Secondary | ICD-10-CM | POA: Diagnosis not present

## 2017-01-27 DIAGNOSIS — F329 Major depressive disorder, single episode, unspecified: Secondary | ICD-10-CM | POA: Diagnosis present

## 2017-01-27 DIAGNOSIS — G459 Transient cerebral ischemic attack, unspecified: Principal | ICD-10-CM | POA: Diagnosis present

## 2017-01-27 DIAGNOSIS — R42 Dizziness and giddiness: Secondary | ICD-10-CM | POA: Diagnosis not present

## 2017-01-27 DIAGNOSIS — R202 Paresthesia of skin: Secondary | ICD-10-CM | POA: Diagnosis not present

## 2017-01-27 DIAGNOSIS — H538 Other visual disturbances: Secondary | ICD-10-CM | POA: Diagnosis not present

## 2017-01-27 DIAGNOSIS — H539 Unspecified visual disturbance: Secondary | ICD-10-CM

## 2017-01-27 LAB — CBC
HEMATOCRIT: 36.5 % (ref 36.0–46.0)
HEMOGLOBIN: 12.2 g/dL (ref 12.0–15.0)
MCH: 28 pg (ref 26.0–34.0)
MCHC: 33.4 g/dL (ref 30.0–36.0)
MCV: 83.9 fL (ref 78.0–100.0)
Platelets: 235 10*3/uL (ref 150–400)
RBC: 4.35 MIL/uL (ref 3.87–5.11)
RDW: 14.6 % (ref 11.5–15.5)
WBC: 9.2 10*3/uL (ref 4.0–10.5)

## 2017-01-27 LAB — COMPREHENSIVE METABOLIC PANEL
ALK PHOS: 50 U/L (ref 38–126)
ALT: 15 U/L (ref 14–54)
AST: 21 U/L (ref 15–41)
Albumin: 4 g/dL (ref 3.5–5.0)
Anion gap: 7 (ref 5–15)
BILIRUBIN TOTAL: 0.4 mg/dL (ref 0.3–1.2)
BUN: 13 mg/dL (ref 6–20)
CALCIUM: 9.3 mg/dL (ref 8.9–10.3)
CO2: 26 mmol/L (ref 22–32)
CREATININE: 0.7 mg/dL (ref 0.44–1.00)
Chloride: 105 mmol/L (ref 101–111)
Glucose, Bld: 118 mg/dL — ABNORMAL HIGH (ref 65–99)
Potassium: 3.5 mmol/L (ref 3.5–5.1)
Sodium: 138 mmol/L (ref 135–145)
Total Protein: 6.7 g/dL (ref 6.5–8.1)

## 2017-01-27 LAB — DIFFERENTIAL
Basophils Absolute: 0 10*3/uL (ref 0.0–0.1)
Basophils Relative: 0 %
Eosinophils Absolute: 0.1 10*3/uL (ref 0.0–0.7)
Eosinophils Relative: 1 %
LYMPHS ABS: 1.4 10*3/uL (ref 0.7–4.0)
LYMPHS PCT: 16 %
MONOS PCT: 9 %
Monocytes Absolute: 0.8 10*3/uL (ref 0.1–1.0)
NEUTROS ABS: 6.9 10*3/uL (ref 1.7–7.7)
Neutrophils Relative %: 74 %

## 2017-01-27 LAB — PREGNANCY, URINE: PREG TEST UR: NEGATIVE

## 2017-01-27 LAB — PROTIME-INR
INR: 1
Prothrombin Time: 13.1 seconds (ref 11.4–15.2)

## 2017-01-27 LAB — APTT: aPTT: 25 seconds (ref 24–36)

## 2017-01-27 LAB — TROPONIN I: Troponin I: <0.03 ng/mL (ref <0.03)

## 2017-01-27 NOTE — ED Provider Notes (Signed)
[place] EMERGENCY DEPARTMENT Provider Note   CSN: [id number] Arrival date & time: 01/27/17  2000     History   Chief Complaint Chief Complaint  Patient presents with  . Loss of Vision    HPI Tamara Williamson is a 52 y.o. female.  HPI  52 year old female presents with blurry vision.  She states that around 7:30 PM tonight she was in her house and she noticed that her phone seem to be vibrating.  She thought it was probably the phone and then when she looked up things were blurry.  She states that it felt like her eyes were crossed and there was double vision but not complete double vision.  Things seem to be moving.  When she got up she was off balance and felt like the room was spinning.  She did not have a headache.  This lasted up to 5 minutes.  She states that when she went to look in the mirror and take out her contacts she could cover one eye and her vision would resolve.  She can cover the other and she could also see perfectly but whenever her both eyes were open she had blurry vision.  She put on her glasses but this did not immediately correct the problem.  She had some bilateral upper and lower lip numbness and fingertip numbness in both hands and toe numbness in both toes.  All of the symptoms have resolved.  Never had weakness or numbness.  Past Medical History:  Diagnosis Date  . Acne   . Allergic rhinitis   . Anxiety    with sleep disorder  . Cold intolerance    Feel her low body fat level is a major contributor to cold extremities (especially since symptoms resolve in warm temperatures). No evidence for Raynaud's phenomena.     . Ecchymosis   . 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 Kidney stone    on one occasion, stone composition unknown  . Major depressive disorder   . Other acne   . Other malaise and fatigue     Patient Active Problem List   Diagnosis Date Noted  . Vision changes 01/27/2017  . TIA (transient  ischemic attack) 01/27/2017  . Major depressive disorder   . FOOT PAIN, RIGHT 08/17/2006    Past Surgical History:  Procedure Laterality Date  . WISDOM TOOTH EXTRACTION  1989    OB History    No data available       Home Medications    Prior to Admission medications   Medication Sig Start Date End Date Taking? Authorizing Provider  cetirizine (ZYRTEC) 10 MG tablet Take 10 mg by mouth daily.    [provider]  eszopiclone (LUNESTA) 2 MG TABS tablet Take 2 mg by mouth as directed. Eszopiclone 2 MG Tablet 1 tablet immediately before bedtime Once a day    [provider]  mometasone (NASONEX) 50 MCG/ACT nasal spray Place 2 sprays into the nose as directed.    [provider]  sertraline (ZOLOFT) 100 MG tablet Take 100 mg by mouth daily.    [provider]    Family History Family History  Problem Relation Age of Onset  . Hypertension Mother   . Hyperlipidemia Mother   . Depression Father        Major depression  . Kidney Stones Father     Social History Social History   Tobacco Use  . Smoking  status: Never Smoker  . Smokeless tobacco: Never Used  Substance Use Topics  . Alcohol use: No    Frequency: Never  . Drug use: No     Allergies   Ambien [zolpidem]; Doxycycline calcium; and Other   Review of Systems Review of Systems  Constitutional: Negative for fever.  Eyes: Positive for visual disturbance. Negative for pain.  Respiratory: Negative for shortness of breath.   Cardiovascular: Negative for chest pain.  Gastrointestinal: Negative for nausea and vomiting.  Musculoskeletal: Positive for gait problem. Negative for neck pain and neck stiffness.  Neurological: Positive for dizziness. Negative for weakness, numbness and headaches.  All other systems reviewed and are negative.    Physical Exam Updated Vital Signs BP 128/85 (BP Location: Left Arm)   Pulse 62   Temp 98.9 F (37.2 C)   Resp 15   Ht 5\' 8"  (1.727 m)    Wt 65.3 kg (144 lb)   LMP 01/13/2017   SpO2 93%   BMI 21.90 kg/m   Physical Exam  Constitutional: She is oriented to person, place, and time. She appears well-developed and well-nourished. No distress.  HENT:  Head: Normocephalic and atraumatic.  Right Ear: External ear normal.  Left Ear: External ear normal.  Nose: Nose normal.  Eyes: EOM are normal. Pupils are equal, round, and reactive to light. Right eye exhibits no discharge. Left eye exhibits no discharge.  Neck: Normal range of motion. Neck supple.  Cardiovascular: Normal rate, regular rhythm and normal heart sounds.  Pulmonary/Chest: Effort normal and breath sounds normal.  Abdominal: Soft. There is no tenderness.  Neurological: She is alert and oriented to person, place, and time.  CN 3-12 grossly intact. 5/5 strength in all 4 extremities. Grossly normal sensation. Normal finger to nose. Normal ambulation  Skin: Skin is warm and dry. She is not diaphoretic.  Nursing note and vitals reviewed.    ED Treatments / Results  Labs (all labs ordered are listed, but only abnormal results are displayed) Labs Reviewed  COMPREHENSIVE METABOLIC PANEL - Abnormal; Notable for the following components:      Result Value   Glucose, Bld 118 (*)    All other components within normal limits  PROTIME-INR  APTT  CBC  DIFFERENTIAL  TROPONIN I  PREGNANCY, URINE  CBG MONITORING, ED    EKG  EKG Interpretation None       Radiology Ct Head Wo Contrast  Result Date: 01/27/2017 CLINICAL DATA:  52 year old female with history of visual changes lasting for few minutes 20 minutes ago. Numbness and tingling in the lips and finger. EXAM: CT HEAD WITHOUT CONTRAST TECHNIQUE: Contiguous axial images were obtained from the base of the skull through the vertex without intravenous contrast. COMPARISON:  Head CT 02/25/2010. FINDINGS: Brain: No evidence of acute infarction, hemorrhage, hydrocephalus, extra-axial collection or mass lesion/mass  effect. Vascular: No hyperdense vessel or unexpected calcification. Skull: Normal. Negative for fracture or focal lesion. Sinuses/Orbits: No acute finding. Other: None. IMPRESSION: 1. No acute intracranial abnormalities. The appearance of the brain is normal. Electronically Signed   By: Vinnie Langton M.D.   On: 01/27/2017 20:51    Procedures Procedures (including critical care time)  Medications Ordered in ED Medications - No data to display   Initial Impression / Assessment and Plan / ED Course  I have reviewed the triage vital signs and the nursing notes.  Pertinent labs & imaging results that were available during my care of the patient were reviewed by me and considered in  my medical decision making (see chart for details).     Patient is completely asymptomatic at this time.  I discussed her case with tele-neurology on-call, Dr. Wendee Copp, who given presentation is concerned about a posterior circulation TIA, which was my concern as well.  Thus, he recommends admission for 24-hour observation and expedited TIA workup.  I discussed with Dr. Alcario Drought but given neurology is not typically at [facility] he has for patient to be admitted to [facility]  Dr. Blaine Hamper accepts in admission and transfer.  Patient has remained stable and no recurrence of symptoms.  Final Clinical Impressions(s) / ED Diagnoses   Final diagnoses:  TIA (transient ischemic attack)    ED Discharge Orders    None       Sherwood Gambler, MD 01/28/17 0008

## 2017-01-27 NOTE — Care Management (Signed)
This is a no charge note  Transfer from [facility] per Dr. Verta Ellen  52 year old lady with past medical history for depression, anxiety, who presents with the vision change. Patient reported to ED physician that she had blurry vision, double vision, feeling of eye crossed. Also had numbness in lips and fingers, dizziness and feeling of room spinning. Symptoms lasted for about 5 minutes, resolved spontaneously. ED physician consulted [place] neurologist, who recommended TIA workup.  Patient was found to have WBC 9.2, INR 1.0, PTT 25, negative troponin, electrolytes renal function okay, temperature normal, no tachycardia, no tachypnea, oxygen saturation 93% on room air, blood pressure normal, negative CT head for acute intracranial abnormalities. Patient is placed on telemetry bed for observation.  Please call manager of [facility] at [phone] when pt arrives to floor   Ivor Costa, MD  [facility] Pager [phone]  If 7PM-7AM, please contact night-coverage [email or web address] Password [email or web address] 01/27/2017, 10:18 PM

## 2017-01-27 NOTE — ED Triage Notes (Signed)
Patient states that about 20 minutes ago had some vision changes like her eyes crossed and this lasted for a few minutes. She currently is not having this problem  - reports that she is having numbness and tingling to her lips and finger.

## 2017-01-28 ENCOUNTER — Observation Stay (HOSPITAL_COMMUNITY): Payer: BLUE CROSS/BLUE SHIELD

## 2017-01-28 ENCOUNTER — Observation Stay (HOSPITAL_BASED_OUTPATIENT_CLINIC_OR_DEPARTMENT_OTHER): Payer: BLUE CROSS/BLUE SHIELD

## 2017-01-28 ENCOUNTER — Encounter (HOSPITAL_COMMUNITY): Payer: Self-pay | Admitting: Internal Medicine

## 2017-01-28 DIAGNOSIS — G459 Transient cerebral ischemic attack, unspecified: Secondary | ICD-10-CM | POA: Diagnosis not present

## 2017-01-28 DIAGNOSIS — H538 Other visual disturbances: Secondary | ICD-10-CM | POA: Diagnosis not present

## 2017-01-28 DIAGNOSIS — I639 Cerebral infarction, unspecified: Secondary | ICD-10-CM | POA: Diagnosis not present

## 2017-01-28 DIAGNOSIS — H539 Unspecified visual disturbance: Secondary | ICD-10-CM

## 2017-01-28 DIAGNOSIS — G458 Other transient cerebral ischemic attacks and related syndromes: Secondary | ICD-10-CM | POA: Diagnosis not present

## 2017-01-28 LAB — COMPREHENSIVE METABOLIC PANEL
ALBUMIN: 3.6 g/dL (ref 3.5–5.0)
ALK PHOS: 45 U/L (ref 38–126)
ALT: 15 U/L (ref 14–54)
ANION GAP: 7 (ref 5–15)
AST: 19 U/L (ref 15–41)
BILIRUBIN TOTAL: 0.7 mg/dL (ref 0.3–1.2)
BUN: 7 mg/dL (ref 6–20)
CALCIUM: 8.4 mg/dL — AB (ref 8.9–10.3)
CO2: 25 mmol/L (ref 22–32)
Chloride: 108 mmol/L (ref 101–111)
Creatinine, Ser: 0.64 mg/dL (ref 0.44–1.00)
GFR calc Af Amer: >60 mL/min (ref >60)
GFR calc non Af Amer: >60 mL/min (ref >60)
GLUCOSE: 77 mg/dL (ref 65–99)
Potassium: 3.1 mmol/L — ABNORMAL LOW (ref 3.5–5.1)
SODIUM: 140 mmol/L (ref 135–145)
TOTAL PROTEIN: 5.7 g/dL — AB (ref 6.5–8.1)

## 2017-01-28 LAB — LIPID PANEL
CHOLESTEROL: 137 mg/dL (ref 0–200)
HDL: 58 mg/dL (ref >40)
LDL Cholesterol: 62 mg/dL (ref 0–99)
TRIGLYCERIDES: 84 mg/dL (ref <150)
Total CHOL/HDL Ratio: 2.4 RATIO
VLDL: 17 mg/dL (ref 0–40)

## 2017-01-28 LAB — CBC
HEMATOCRIT: 38 % (ref 36.0–46.0)
HEMOGLOBIN: 12.1 g/dL (ref 12.0–15.0)
MCH: 26.9 pg (ref 26.0–34.0)
MCHC: 31.8 g/dL (ref 30.0–36.0)
MCV: 84.4 fL (ref 78.0–100.0)
Platelets: 215 10*3/uL (ref 150–400)
RBC: 4.5 MIL/uL (ref 3.87–5.11)
RDW: 14.6 % (ref 11.5–15.5)
WBC: 6.7 10*3/uL (ref 4.0–10.5)

## 2017-01-28 LAB — ECHOCARDIOGRAM COMPLETE
HEIGHTINCHES: 68 in
Weight: 2304 oz

## 2017-01-28 LAB — HEMOGLOBIN A1C
Hgb A1c MFr Bld: 5.3 % (ref 4.8–5.6)
Mean Plasma Glucose: 105.41 mg/dL

## 2017-01-28 MED ORDER — SERTRALINE HCL 100 MG PO TABS
100.0000 mg | ORAL_TABLET | Freq: Every day | ORAL | Status: DC
  Administered 2017-01-28: 100 mg via ORAL
  Filled 2017-01-28: qty 1

## 2017-01-28 MED ORDER — ASPIRIN EC 81 MG PO TBEC: 81.0000 mg | DELAYED_RELEASE_TABLET | Freq: Every day | ORAL | 0 refills | Status: DC

## 2017-01-28 MED ORDER — SODIUM CHLORIDE 0.9 % IV SOLN: INTRAVENOUS | Status: DC

## 2017-01-28 MED ORDER — ACETAMINOPHEN 325 MG PO TABS: 650.0000 mg | ORAL_TABLET | ORAL | Status: DC | PRN

## 2017-01-28 MED ORDER — ENOXAPARIN SODIUM 40 MG/0.4ML ~~LOC~~ SOLN
40.0000 mg | Freq: Every day | SUBCUTANEOUS | Status: DC
  Administered 2017-01-28: 40 mg via SUBCUTANEOUS
  Filled 2017-01-28: qty 0.4

## 2017-01-28 MED ORDER — ASPIRIN 300 MG RE SUPP: 300.0000 mg | Freq: Every day | RECTAL | Status: DC

## 2017-01-28 MED ORDER — STROKE: EARLY STAGES OF RECOVERY BOOK
Freq: Once | Status: AC
  Administered 2017-01-28: 20:00:00

## 2017-01-28 MED ORDER — ASPIRIN 325 MG PO TABS
325.0000 mg | ORAL_TABLET | Freq: Every day | ORAL | Status: DC
  Administered 2017-01-28: 325 mg via ORAL
  Filled 2017-01-28: qty 1

## 2017-01-28 MED ORDER — ACETAMINOPHEN 160 MG/5ML PO SOLN: 650.0000 mg | ORAL | Status: DC | PRN

## 2017-01-28 MED ORDER — ACETAMINOPHEN 650 MG RE SUPP: 650.0000 mg | RECTAL | Status: DC | PRN

## 2017-01-28 MED ORDER — LORAZEPAM 2 MG/ML IJ SOLN
1.0000 mg | Freq: Once | INTRAMUSCULAR | Status: AC
  Administered 2017-01-28: 1 mg via INTRAVENOUS
  Filled 2017-01-28: qty 1

## 2017-01-28 MED ORDER — IOPAMIDOL (ISOVUE-370) INJECTION 76%
INTRAVENOUS | Status: AC
  Administered 2017-01-28: 50 mL
  Filled 2017-01-28: qty 50

## 2017-01-28 MED ORDER — POTASSIUM CHLORIDE CRYS ER 20 MEQ PO TBCR: 40.0000 meq | EXTENDED_RELEASE_TABLET | Freq: Once | ORAL | Status: DC

## 2017-01-28 NOTE — Consult Note (Signed)
Requesting Physician: Dr. Algis Liming    Chief Complaint: Transient abnormal vision  History obtained from:  Patient    HPI:                                                                                                                                         Tamara Williamson is an 52 y.o. female with past medical history of a fall back in 2011 which resulted in a clavicular fracture and also a right subclavian injury to which needed to be repaired just distal to the right takeoff of the right vertebral artery.  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  Closure of the subclavian artery was done with interrupted 5-0 proline sutures.  Patient has had noticed difficulties since this procedure.  She does note that she has had migraines in the past which has caused wavy lines in her left visual field.  Of note she is never had a headache with her migraines.  Last night she had finished a workout, sat down to eat a bowl of cereal and noted that her vision seemed as though she was looking through a kaleidoscope with fractured pieces of vision.  There was no spinning, dizziness, loss of vision, or double vision.  Patient was able to stand up but when she started to walk to do the visual difficulty she was having difficulty and had to hold onto the walls.  Patient did go to the bathroom cover each eye and noted that when one eye was covered there was no difficulties however when both eyes were open she was having difficulties.  He did not notice any abnormal conjugation of her eyes and they did see midline to her.  She was worried it was her contacts if she took her contacts out put her glasses on.  This whole incident only lasted for approximately 15 minutes and then resolved.  She also noticed that she had tingling of her bilateral eyelids, lips, fingers, and toes.  She wonders if this was due to significant anxiety from what was going on.  She does not note any  weakness of her upper extremities or lower extremities.  She was able to move all extremities and had full sensation throughout.  Patient did not know any tonic-clonic or seizure activity.  She did have bowel or bladder incontinence.  She had had trauma to the left temporal region secondary to the fall 2011 but denies any seizure activity from that.  Date last known well: Date: 01/27/2017 Time last known well: Time: 19:30 tPA Given: No: Symptoms resolved [id number] Modified Rankin: Rankin Score=0    Past Medical History:  Diagnosis Date  . Acne   . Allergic rhinitis   . Anxiety    with sleep disorder  . Cold intolerance    Feel her low body fat level is a major  contributor to cold extremities (especially since symptoms resolve in warm temperatures). No evidence for Raynaud's phenomena.     . Ecchymosis   . 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 Kidney stone    on one occasion, stone composition unknown  . Major depressive disorder   . Other acne   . Other malaise and fatigue     Past Surgical History:  Procedure Laterality Date  . WISDOM TOOTH EXTRACTION  1989    Family History  Problem Relation Age of Onset  . Hypertension Mother   . Hyperlipidemia Mother   . Depression Father        Major depression  . Kidney Stones Father    Social History:  reports that  has never smoked. she has never used smokeless tobacco. She reports that she does not drink alcohol or use drugs.  Allergies:  Allergies  Allergen Reactions  . Ambien [Zolpidem]     memory loss  . Doxycycline Calcium Hives  . Other     dorxy: facial swelling    Medications:                                                                                                                           Scheduled: .  stroke: mapping our early stages of recovery book   Does not apply Once  . aspirin  300 mg Rectal Daily   Or  . aspirin  325 mg Oral Daily  . enoxaparin (LOVENOX) injection  40  mg Subcutaneous Daily  . sertraline  100 mg Oral Daily    ROS:                                                                                                                                       History obtained from the patient  General ROS: negative for - chills, fatigue, fever, night sweats, weight gain or weight loss Psychological ROS: negative for - behavioral disorder, hallucinations, memory difficulties, mood swings or suicidal ideation Ophthalmic ROS: negative for - blurry vision, double vision, eye pain or loss of vision ENT ROS: negative for - epistaxis, nasal discharge, oral lesions, sore throat, tinnitus or vertigo Allergy and Immunology ROS: negative for - hives or itchy/watery eyes Hematological and Lymphatic ROS: negative for - bleeding problems, bruising or swollen lymph nodes Endocrine ROS: negative for -  galactorrhea, hair pattern changes, polydipsia/polyuria or temperature intolerance Respiratory ROS: negative for - cough, hemoptysis, shortness of breath or wheezing Cardiovascular ROS: negative for - chest pain, dyspnea on exertion, edema or irregular heartbeat Gastrointestinal ROS: negative for - abdominal pain, diarrhea, hematemesis, nausea/vomiting or stool incontinence Genito-Urinary ROS: negative for - dysuria, hematuria, incontinence or urinary frequency/urgency Musculoskeletal ROS: negative for - joint swelling or muscular weakness Neurological ROS: as noted in HPI Dermatological ROS: negative for rash and skin lesion changes  Neurologic Examination:                                                                                                      Blood pressure 129/76, pulse 61, temperature 98.4 F (36.9 C), temperature source Oral, resp. rate 18, height 5\' 8"  (1.727 m), weight 65.3 kg (144 lb), last menstrual period 01/13/2017, SpO2 99 %.  HEENT-  Normocephalic, no lesions, without obvious abnormality.  Normal external eye and conjunctiva.  Normal TM's  bilaterally.  Normal auditory canals and external ears. Normal external nose, mucus membranes and septum.  Normal pharynx. Cardiovascular- S1, S2 normal, pulses palpable throughout   Lungs- chest clear, no wheezing, rales, normal symmetric air entry Abdomen- normal findings: bowel sounds normal Extremities- no edema Lymph-no adenopathy palpable Musculoskeletal-no joint tenderness, deformity or swelling Skin-warm and dry, no hyperpigmentation, vitiligo, or suspicious lesions  Neurological Examination Mental Status: Alert, oriented, thought content appropriate.  Speech fluent without evidence of aphasia.  Able to follow 3 step commands without difficulty. Cranial Nerves: II: Discs flat bilaterally; Visual fields grossly normal,  III,IV, VI: ptosis not present, extra-ocular motions intact bilaterally, pupils equal, round, reactive to light and accommodation V,VII: smile symmetric, facial light touch sensation normal bilaterally VIII: hearing normal bilaterally IX,X: uvula rises symmetrically XI: bilateral shoulder shrug XII: midline tongue extension Motor: Right : Upper extremity   5/5    Left:     Upper extremity   5/5  Lower extremity   5/5     Lower extremity   5/5 Tone and bulk:normal tone throughout; no atrophy noted Sensory: Pinprick and light touch intact throughout, bilaterally Deep Tendon Reflexes: 2+ and symmetric throughout Plantars: Right: downgoing   Left: downgoing Cerebellar: normal finger-to-nose, normal rapid alternating movements and normal heel-to-shin test Gait: normal gait and station       Lab Results: Basic Metabolic Panel: Recent Labs  Lab 01/27/17 2017  NA 138  K 3.5  CL 105  CO2 26  GLUCOSE 118*  BUN 13  CREATININE 0.70  CALCIUM 9.3    Liver Function Tests: Recent Labs  Lab 01/27/17 2017  AST 21  ALT 15  ALKPHOS 50  BILITOT 0.4  PROT 6.7  ALBUMIN 4.0   No results for input(s): LIPASE, AMYLASE in the last 168 hours. No results for  input(s): AMMONIA in the last 168 hours.  CBC: Recent Labs  Lab 01/27/17 2017  WBC 9.2  NEUTROABS 6.9  HGB 12.2  HCT 36.5  MCV 83.9  PLT 235    Cardiac Enzymes: Recent Labs  Lab 01/27/17 2017  TROPONINI <0.03  Lipid Panel: No results for input(s): CHOL, TRIG, HDL, CHOLHDL, VLDL, LDLCALC in the last 168 hours.  CBG: No results for input(s): GLUCAP in the last 168 hours.  Microbiology: Results for orders placed or performed during the hospital encounter of 02/24/10  MRSA PCR Screening     Status: None   Collection Time: 02/24/10  5:36 PM  Result Value Ref Range Status   MRSA by PCR  NEGATIVE Final    NEGATIVE        The GeneXpert MRSA Assay (FDA approved for NASAL specimens only), is one component of a comprehensive MRSA colonization surveillance program. It is not intended to diagnose MRSA infection nor to guide or monitor treatment for MRSA infections.    Coagulation Studies: Recent Labs    01/27/17 18-Apr-2015  LABPROT 13.1  INR 1.00    Imaging: Ct Head Wo Contrast  Result Date: 01/27/2017 CLINICAL DATA:  52 year old female with history of visual changes lasting for few minutes 20 minutes ago. Numbness and tingling in the lips and finger. EXAM: CT HEAD WITHOUT CONTRAST TECHNIQUE: Contiguous axial images were obtained from the base of the skull through the vertex without intravenous contrast. COMPARISON:  Head CT 02/25/2010. FINDINGS: Brain: No evidence of acute infarction, hemorrhage, hydrocephalus, extra-axial collection or mass lesion/mass effect. Vascular: No hyperdense vessel or unexpected calcification. Skull: Normal. Negative for fracture or focal lesion. Sinuses/Orbits: No acute finding. Other: None. IMPRESSION: 1. No acute intracranial abnormalities. The appearance of the brain is normal. Electronically Signed   By: Vinnie Langton M.D.   On: 01/27/2017 20:51   Mr Brain Wo Contrast  Result Date: 01/28/2017 CLINICAL DATA:  Blurry vision EXAM: MRI HEAD  WITHOUT CONTRAST MRA HEAD WITHOUT CONTRAST TECHNIQUE: Multiplanar, multiecho pulse sequences of the brain and surrounding structures were obtained without intravenous contrast. Angiographic images of the head were obtained using MRA technique without contrast. COMPARISON:  Head CT 01/27/2017 FINDINGS: MRI HEAD FINDINGS Brain: The midline structures are normal. No focal diffusion restriction to indicate acute infarct. No intraparenchymal hemorrhage. Area of encephalomalacia in the left temporal lobe is unchanged. The parenchymal signal of the brain is otherwise normal. No mass lesion. No chronic microhemorrhage or cerebral amyloid angiopathy. No hydrocephalus, age advanced atrophy or lobar predominant volume loss. No dural abnormality or extra-axial collection. Skull and upper cervical spine: The visualized skull base, calvarium, upper cervical spine and extracranial soft tissues are normal. Sinuses/Orbits: No fluid levels or advanced mucosal thickening. No mastoid effusion. Normal orbits. MRA HEAD FINDINGS Intracranial internal carotid arteries: Normal. Anterior cerebral arteries: Normal. Middle cerebral arteries: Normal. Posterior communicating arteries: Present bilaterally. Posterior cerebral arteries: Normal. Basilar artery: Normal. Vertebral arteries: Left dominant. Normal. Superior cerebellar arteries: Normal. Anterior inferior cerebellar arteries: Normal on the right. Not clearly seen on the left. Posterior inferior cerebellar arteries: Normal. IMPRESSION: 1. No acute intracranial abnormality. 2. No emergent large vessel occlusion or high-grade intracranial stenosis. 3. 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 Electronically Signed   By: Ulyses Jarred M.D.   On: 01/28/2017 06:18   Mr Jodene Nam Head Wo Contrast  Result Date: 01/28/2017 CLINICAL DATA:  Blurry vision EXAM: MRI HEAD WITHOUT CONTRAST MRA HEAD WITHOUT CONTRAST TECHNIQUE: Multiplanar, multiecho pulse sequences of the brain and  surrounding structures were obtained without intravenous contrast. Angiographic images of the head were obtained using MRA technique without contrast. COMPARISON:  Head CT 01/27/2017 FINDINGS: MRI HEAD FINDINGS Brain: The midline structures are normal. No focal diffusion restriction to indicate acute infarct. No  intraparenchymal hemorrhage. Area of encephalomalacia in the left temporal lobe is unchanged. The parenchymal signal of the brain is otherwise normal. No mass lesion. No chronic microhemorrhage or cerebral amyloid angiopathy. No hydrocephalus, age advanced atrophy or lobar predominant volume loss. No dural abnormality or extra-axial collection. Skull and upper cervical spine: The visualized skull base, calvarium, upper cervical spine and extracranial soft tissues are normal. Sinuses/Orbits: No fluid levels or advanced mucosal thickening. No mastoid effusion. Normal orbits. MRA HEAD FINDINGS Intracranial internal carotid arteries: Normal. Anterior cerebral arteries: Normal. Middle cerebral arteries: Normal. Posterior communicating arteries: Present bilaterally. Posterior cerebral arteries: Normal. Basilar artery: Normal. Vertebral arteries: Left dominant. Normal. Superior cerebellar arteries: Normal. Anterior inferior cerebellar arteries: Normal on the right. Not clearly seen on the left. Posterior inferior cerebellar arteries: Normal. IMPRESSION: 1. No acute intracranial abnormality. 2. No emergent large vessel occlusion or high-grade intracranial stenosis. 3. 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 Electronically Signed   By: Ulyses Jarred M.D.   On: 01/28/2017 06:18       Assessment and plan discussed with with attending physician and they are in agreement.    Etta Quill PA-C [facility] [phone]  01/28/2017, 9:07 AM   Assessment: 52 y.o. female with transient visual abnormality.  Her description of improvement with covering either eye, and deficit  only being present with both eyes uncovered make me think that this is a form of diplopia.  Neither migraine, seizure, or cortical infarct would cause this pattern.  If a transient 4th nerve palsy or skew deviation gave an unusual diplopia look, she may describe it like this.  With only a single episode of transient diplopia, I think myasthenia is less likely, microvascular nerve palsy is unlikely given lack of risk factors as well as rapid resolution.  TIA is a consideration, and though I am not definite on this, with LDL less than 70, I think the only intervention to offer would be antiplatelet therapy.  Would favor baby aspirin daily.  Stroke Risk Factors - none  1) aspirin 81 mg daily 2) CTA head and neck, follow-up echo results 3) barring unexpected findings on CTA or echo, likely could be discharged home.   Roland Rack, MD [facility] [phone]  If 7pm- 7am, please page neurology on call as listed in [place].

## 2017-01-28 NOTE — H&P (Signed)
History and Physical    Tamara Williamson PYP:[id number] DOB: 05/18/1965 DOA: 01/27/2017  PCP: Cari Caraway, MD  Patient coming from: Home.  Chief Complaint: Blurred vision.  HPI: Tamara Williamson is a 52 y.o. female with history of depression presents to the ER with complaints of having abnormal vision where patient was saying things blurred when both eyes open and on starting 1 of the eyes things were clear.  She felt like as if she was seen through the [place].  Denies any headache difficulty speaking swallowing or any weakness of her extremities.  Patient symptoms resolved in a few minutes.  ED Course: CT of the head was unremarkable her EKG was showing normal sinus rhythm.  Patient appears nonfocal.  Neurologist on call was contacted by ER physician and requested TIA workup.  Review of Systems: As per HPI, rest all negative.   Past Medical History:  Diagnosis Date  . Acne   . Allergic rhinitis   . Anxiety    with sleep disorder  . Cold intolerance    Feel her low body fat level is a major contributor to cold extremities (especially since symptoms resolve in warm temperatures). No evidence for Raynaud's phenomena.     . Ecchymosis   . 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 Kidney stone    on one occasion, stone composition unknown  . Major depressive disorder   . Other acne   . Other malaise and fatigue     Past Surgical History:  Procedure Laterality Date  . [place]     reports that  has never smoked. she has never used smokeless tobacco. She reports that she does not drink alcohol or use drugs.  Allergies  Allergen Reactions  . Ambien [Zolpidem]     memory loss  . Doxycycline Calcium Hives  . Other     dorxy: facial swelling    Family History  Problem Relation Age of Onset  . Hypertension Mother   . Hyperlipidemia Mother   . Depression Father        Major depression  . Kidney Stones Father      Prior to Admission medications   Medication Sig Start Date End Date Taking? Authorizing Provider  cetirizine (ZYRTEC) 10 MG tablet Take 10 mg by mouth daily.    [provider]  eszopiclone (LUNESTA) 2 MG TABS tablet Take 2 mg by mouth as directed. Eszopiclone 2 MG Tablet 1 tablet immediately before bedtime Once a day    [provider]  mometasone (NASONEX) 50 MCG/ACT nasal spray Place 2 sprays into the nose as directed.    [provider]  sertraline (ZOLOFT) 100 MG tablet Take 100 mg by mouth daily.    [provider]    Physical Exam: Vitals:   01/27/17 2010 01/27/17 2126 01/27/17 2347 01/28/17 0042  BP:  128/85  134/79  Pulse:  62  (!) 58  Resp:  15  18  Temp:   98.9 F (37.2 C) 98.6 F (37 C)  TempSrc:    Oral  SpO2:  93%  98%  Weight: 65.3 kg (144 lb)   65.3 kg (144 lb)  Height: 5\' 8"  (1.727 m)   5\' 8"  (1.727 m)      Constitutional: Moderately built and nourished. Vitals:   01/27/17 2010 01/27/17 2126 01/27/17 2347 01/28/17 0042  BP:  128/85  134/79  Pulse:  62  (!) 58  Resp:  15  18  Temp:   98.9 F (37.2 C) 98.6 F (37 C)  TempSrc:    Oral  SpO2:  93%  98%  Weight: 65.3 kg (144 lb)   65.3 kg (144 lb)  Height: 5\' 8"  (1.727 m)   5\' 8"  (1.727 m)   Eyes: Anicteric no pallor. ENMT: No discharge from the ears eyes nose or mouth. Neck: No neck rigidity no mass felt. Respiratory: No rhonchi or crepitations. Cardiovascular: S1-S2 heard no murmurs appreciated. Abdomen: Soft nontender bowel sounds present. Musculoskeletal: No edema.  No joint effusion. Skin: No rash.  Skin appears warm. Neurologic: Alert awake oriented to time place and person.  Moves all extremities 5 x 5.  No facial asymmetry.  Tongue is midline. Psychiatric: Appears normal.  Normal affect.   Labs on Admission: I have personally reviewed following labs and imaging studies  CBC: Recent Labs  Lab 01/27/17 2017  WBC 9.2  NEUTROABS 6.9  HGB 12.2  HCT  36.5  MCV 83.9  PLT 762   Basic Metabolic Panel: Recent Labs  Lab 01/27/17 2017  NA 138  K 3.5  CL 105  CO2 26  GLUCOSE 118*  BUN 13  CREATININE 0.70  CALCIUM 9.3   GFR: Estimated Creatinine Clearance: 85.8 mL/min (by C-G formula based on SCr of 0.7 mg/dL). Liver Function Tests: Recent Labs  Lab 01/27/17 2017  AST 21  ALT 15  ALKPHOS 50  BILITOT 0.4  PROT 6.7  ALBUMIN 4.0   No results for input(s): LIPASE, AMYLASE in the last 168 hours. No results for input(s): AMMONIA in the last 168 hours. Coagulation Profile: Recent Labs  Lab 01/27/17 2017  INR 1.00   Cardiac Enzymes: Recent Labs  Lab 01/27/17 2017  TROPONINI <0.03   BNP (last 3 results) No results for input(s): PROBNP in the last 8760 hours. HbA1C: No results for input(s): HGBA1C in the last 72 hours. CBG: No results for input(s): GLUCAP in the last 168 hours. Lipid Profile: No results for input(s): CHOL, HDL, LDLCALC, TRIG, CHOLHDL, LDLDIRECT in the last 72 hours. Thyroid Function Tests: No results for input(s): TSH, T4TOTAL, FREET4, T3FREE, THYROIDAB in the last 72 hours. Anemia Panel: No results for input(s): VITAMINB12, FOLATE, FERRITIN, TIBC, IRON, RETICCTPCT in the last 72 hours. Urine analysis:    Component Value Date/Time   COLORURINE YELLOW 02/24/2010 1629   APPEARANCEUR HAZY (A) 02/24/2010 1629   LABSPEC 1.020 02/24/2010 1629   PHURINE 5.0 02/24/2010 1629   HGBUR SMALL (A) 02/24/2010 1629   BILIRUBINUR NEGATIVE 02/24/2010 1629   KETONESUR NEGATIVE 02/24/2010 1629   PROTEINUR 30 (A) 02/24/2010 1629   UROBILINOGEN 0.2 02/24/2010 1629   NITRITE NEGATIVE 02/24/2010 1629   LEUKOCYTESUR NEGATIVE 02/24/2010 1629   Sepsis Labs: @LABRCNTIP (procalcitonin:4,lacticidven:4) )No results found for this or any previous visit (from the past 240 hour(s)).   Radiological Exams on Admission: Ct Head Wo Contrast  Result Date: 01/27/2017 CLINICAL DATA:  52 year old female with history of visual  changes lasting for few minutes 20 minutes ago. Numbness and tingling in the lips and finger. EXAM: CT HEAD WITHOUT CONTRAST TECHNIQUE: Contiguous axial images were obtained from the base of the skull through the vertex without intravenous contrast. COMPARISON:  Head CT 02/25/2010. FINDINGS: Brain: No evidence of acute infarction, hemorrhage, hydrocephalus, extra-axial collection or mass lesion/mass effect. Vascular: No hyperdense vessel or unexpected calcification. Skull: Normal. Negative for fracture or focal lesion. Sinuses/Orbits: No acute finding. Other: None. IMPRESSION: 1. No acute intracranial abnormalities. The  appearance of the brain is normal. Electronically Signed   By: Vinnie Langton M.D.   On: 01/27/2017 20:51    EKG: Independently reviewed.  Normal sinus rhythm.  Assessment/Plan Principal Problem:   TIA (transient ischemic attack) Active Problems:   Major depressive disorder   Vision changes    1. Possible TIA -MRI of the brain/MRA brain 2D echo carotid Doppler has been ordered.  Patient is on aspirin.  Neurology has been consulted.  Further recommendation based on the tests ordered and neurology consult. 2. History of depression on sertraline.   DVT prophylaxis: Lovenox. Code Status: Full code. Family Communication: Discussed with patient. Disposition Plan: Home. Consults called: Neurology. Admission status: Observation.   Rise Patience MD [facility] Pager [phone].  If 7PM-7AM, please contact night-coverage [email or web address] Password [email or web address]  01/28/2017, 3:08 AM

## 2017-01-28 NOTE — Progress Notes (Signed)
OT Cancellation Note and Discharge  Patient Details Name: Tamara Williamson MRN: [id number] DOB: Jul 24, 1965   Cancelled Treatment:    Reason Eval/Treat Not Completed: Other (comment). Noted with PT pt reported total resolving of symptoms and was Mod I 200 feet with PT. Texted with PT and she reports she did not see any issues that OT needed to assess. We will sign off.  Almon Register [phone] 01/28/2017, 4:07 PM

## 2017-01-28 NOTE — Progress Notes (Signed)
VASCULAR LAB PRELIMINARY  PRELIMINARY  PRELIMINARY  PRELIMINARY  Carotid duplex completed.    Preliminary report:  1-39% ICA plaquing.  Vertebral artery flow is antegrade.   Tamara Williamson, RVT 01/28/2017, 10:08 AM

## 2017-01-28 NOTE — Progress Notes (Signed)
Patient discharged from room 3W-36 via wheelchair in stable condition, A&O x4. All patient's belongings sent with patient  Discharge instructions reviewed & all patient's questions answered.

## 2017-01-28 NOTE — Evaluation (Signed)
Physical Therapy Evaluation Patient Details Name: Tamara Williamson MRN: [id number] DOB: [birth year] Today's Date: 01/28/2017   History of Present Illness  52yo female with complains of blurred vision, visual changes. Her symptoms lasted about 5 minutes then resolved, and she was admitted for TIA workup. Head CT was unremarkable, EKG normal. PMH anxiety and depression.   Clinical Impression    Patient received in bed, very pleasant and willing to participate with skilled PT evaluation this morning. She confirms that her symptoms have resolved and she feels generally at her baseline. She is able to perform all functional mobility, transfers, and gait with I-Mod(I), and was able to complete a full flight of stairs easily with no railing and no concerns for balance impairment. Coordination and sensation to light touch appear intact, she easily scores 22/24 on the DGI with main impairment being head turns during gait. Patient does not appear to be in need of skilled PT services during her hospital stay, and will not require PT follow up moving forward. PT signing off, thank you for the referral.     Follow Up Recommendations No PT follow up    Equipment Recommendations  None recommended by PT    Recommendations for Other Services       Precautions / Restrictions Precautions Precautions: None Restrictions Weight Bearing Restrictions: No      Mobility  Bed Mobility Overal bed mobility: Modified Independent                Transfers Overall transfer level: Modified independent Equipment used: None                Ambulation/Gait Ambulation/Gait assistance: Modified independent (Device/Increase time) Ambulation Distance (Feet): 200 Feet Assistive device: None Gait Pattern/deviations: WFL(Within Functional Limits)        Stairs Stairs: Yes Stairs assistance: Modified independent (Device/Increase time) Stair Management: Forwards;No rails Number of Stairs: 12     Wheelchair Mobility    Modified Rankin (Stroke Patients Only)       Balance Overall balance assessment: Independent;No apparent balance deficits (not formally assessed)                               Standardized Balance Assessment Standardized Balance Assessment : Dynamic Gait Index   Dynamic Gait Index Level Surface: Normal Change in Gait Speed: Normal Gait with Horizontal Head Turns: Mild Impairment Gait with Vertical Head Turns: Mild Impairment Gait and Pivot Turn: Normal Step Over Obstacle: Normal Step Around Obstacles: Normal Steps: Normal Total Score: 22       Pertinent Vitals/Pain Pain Assessment: 0-10 Pain Score: 0-No pain Pain Intervention(s): Monitored during session    Home Living Family/patient expects to be discharged to:: Private residence Living Arrangements: Children Available Help at Discharge: Family Type of Home: House Home Access: Stairs to enter Entrance Stairs-Rails: None Technical brewer of Steps: 1 Home Layout: One level Home Equipment: None      Prior Function Level of Independence: Independent               Hand Dominance   Dominant Hand: Right    Extremity/Trunk Assessment   Upper Extremity Assessment Upper Extremity Assessment: Defer to OT evaluation    Lower Extremity Assessment Lower Extremity Assessment: Overall WFL for tasks assessed    Cervical / Trunk Assessment Cervical / Trunk Assessment: Normal  Communication   Communication: No difficulties  Cognition Arousal/Alertness: Awake/alert Behavior During Therapy: WFL for tasks assessed/performed Overall  Cognitive Status: Within Functional Limits for tasks assessed                                        General Comments      Exercises     Assessment/Plan    PT Assessment Patent does not need any further PT services  PT Problem List         PT Treatment Interventions      PT Goals (Current goals can be found  in the Care Plan section)  Acute Rehab PT Goals Patient Stated Goal: to go home  PT Goal Formulation: With patient Time For Goal Achievement: 02/04/17 Potential to Achieve Goals: Good    Frequency     Barriers to discharge        Co-evaluation               AM-PAC PT "6 Clicks" Daily Activity  Outcome Measure Difficulty turning over in bed (including adjusting bedclothes, sheets and blankets)?: None Difficulty moving from lying on back to sitting on the side of the bed? : None Difficulty sitting down on and standing up from a chair with arms (e.g., wheelchair, bedside commode, etc,.)?: None Help needed moving to and from a bed to chair (including a wheelchair)?: None Help needed walking in hospital room?: None Help needed climbing 3-5 steps with a railing? : None 6 Click Score: 24    End of Session   Activity Tolerance: Patient tolerated treatment well Patient left: in bed;with call bell/phone within reach;with family/visitor present   PT Visit Diagnosis: Other symptoms and signs involving the nervous system (R29.898)    Time: 0900-0909 PT Time Calculation (min) (ACUTE ONLY): 9 min   Charges:   PT Evaluation $PT Eval Low Complexity: 1 Low     PT G Codes:   PT G-Codes **NOT FOR INPATIENT CLASS** Functional Assessment Tool Used: AM-PAC 6 Clicks Basic Mobility;Clinical judgement    Deniece Ree PT, DPT, CBIS  Supplemental Physical Therapist [place]   Pager [phone]

## 2017-01-28 NOTE — Discharge Summary (Signed)
Physician Discharge Summary  Tamara Williamson BMW:[id number] DOB: 03-Jan-1966  PCP: Cari Caraway, MD  Admit date: 01/27/2017 Discharge date: 01/28/2017  Recommendations for Outpatient Follow-up:  1. Dr. Cari Caraway, PCP in 5 days with repeat labs (BMP). 2. [facility] in 6 weeks. Ambulatory Neurology referral has been sent. Patient has been advised to call if she does not hear from them in the next few days.  Home Health: None Equipment/Devices: None    Discharge Condition: Improved and stable  CODE STATUS: Full  Diet recommendation: Heart healthy diet.  Discharge Diagnoses:  Principal Problem:   TIA (transient ischemic attack) Active Problems:   Major depressive disorder   Vision changes   Brief Summary: 52 year old female with PMH of hospitalization in February 2012 for fall, TBI, right clavicle fracture, right rib fracture, VDRF, right subclavian artery injury, underwent subclavian artery exploration and repair; migraine and depression presented to the ED with sudden onset of abnormal vision. She completed approximately 40 minutes of high intensity workout after looking at you tube. She was then sitting and watching TV on 01/28/17 at approximately 7:30 PM when her vision suddenly became abnormal. She describes this as "everything looks fractured and in pieces" like looking through a kaleidoscope. She tried closing one eye and then the next but uniocular vision was okay. She then attempted to walk to the bathroom and was unsteady and bumping into things. When she looked in the mirror, she did not see any squint. She denied headache, dizziness, lightheadedness, slurred speech, facial asymmetry. She became anxious and then noted some inkling in the tip of her toes, fingers, year lobes and lips. Symptoms lasted approximately 5 minutes and resolved without recurrence. She initially presented to [facility]. EDP consulted Tele Neurologist who recommended a TIA  workup. She was then transferred to and admitted to [facility]. Neurology was consulted. They felt that her presentation was a form of diplopia. Neither migraine, seizure or cortical infarct would cause this pattern. Myasthenia was also felt less likely. I discussed with Neurology and they felt that this was probably a TIA although not definite on this. She completed TIA workup as indicated below which was reviewed with Neurology. She was recommended Aspirin 81 MG daily and outpatient follow-up with neurology in 6 weeks. PT and OT did not see any needs. Hypokalemia was replaced prior to discharge. Patient was encouraged to consume a high potassium diet. Urine pregnancy test negative.   Lipid panel: LDL 62. A1c: 5.3. 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 Small nondominant left vertebral artery occludes at the C1 level, unchanged from CT 2012. Right vertebral artery widely patent. Both carotid arteries widely patent. No significant intracranial stenosis. Negative for emergent large vessel occlusion. CT head without contrast 01/27/17: No acute intracranial abnormalities. The appearance of the brain is normal. MRI & MRA of the head 01/28/17: No acute intracranial abnormality. No emergent large vessel occlusion or high-grade intracranial stenosis. 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 2-D echo: No cardiac source of emboli was identified. LV systolic function was normal. Carotid Dopplers: Right ICA 1-39 percent stenosis. Left ICA 1-39 percent stenosis. Both vertebral arteries were patent with antegrade flow.      Consultations:  Neurology   Procedures:  As above.   Discharge Instructions  Discharge Instructions    Activity as tolerated - No restrictions   Complete by:  As directed    Ambulatory referral  to Neurology   Complete by:  As directed    An appointment is requested in  approximately: 6 weeks.   Call MD for:   Complete by:  As directed    Recurrent eye symptoms or strokelike symptoms.   Diet - low sodium heart healthy   Complete by:  As directed        Medication List    TAKE these medications   aspirin EC 81 MG tablet Take 1 tablet (81 mg total) by mouth daily. Start taking on:  01/29/2017   cetirizine 10 MG tablet Commonly known as:  ZYRTEC Take 10 mg by mouth daily.   eszopiclone 2 MG Tabs tablet Commonly known as:  LUNESTA Take 2 mg by mouth at bedtime as needed. Eszopiclone 2 MG Tablet 1 tablet immediately before bedtime Once a day   mometasone 50 MCG/ACT nasal spray Commonly known as:  NASONEX Place 2 sprays into the nose as needed.   sertraline 100 MG tablet Commonly known as:  ZOLOFT Take 100 mg by mouth daily.      Follow-up Information    Cari Caraway, MD. Schedule an appointment as soon as possible for a visit in 5 day(s).   Specialty:  Family Medicine Why:  To be seen with repeat labs (BMP). Contact information: [place] [address] [facility]. Schedule an appointment as soon as possible for a visit in 6 week(s).   Contact information: [address] [place] [address]  Phone number: [phone]         Allergies  Allergen Reactions  . Ambien [Zolpidem]     memory loss  . Doxycycline Calcium Hives  . Other     dorxy: facial swelling      Procedures/Studies: Ct Angio Head W Or Wo Contrast  Result Date: 01/28/2017 CLINICAL DATA:  Stroke EXAM: CT ANGIOGRAPHY HEAD AND NECK TECHNIQUE: Multidetector CT imaging of the head and neck was performed using the standard protocol during bolus administration of intravenous contrast. Multiplanar CT image reconstructions and MIPs were obtained to evaluate the vascular anatomy. Carotid stenosis measurements (when applicable) are obtained utilizing NASCET criteria, using the distal internal  carotid diameter as the denominator. CONTRAST:  <See Chart> ISOVUE-370 IOPAMIDOL (ISOVUE-370) INJECTION 76% COMPARISON:  MRI 01/28/2017, CT 02/24/2010 FINDINGS: CT HEAD FINDINGS Brain: Chronic encephalomalacia left posterolateral temporal lobe compatible with prior traumatic brain injury. No acute infarct or hemorrhage or mass. Ventricle size is normal. Vascular: Negative for hyperdense vessel Skull: Chronic left temporal fracture unchanged from prior studies Sinuses: Minimal mucosal edema paranasal sinuses. Orbits: Normal Review of the MIP images confirms the above findings CTA NECK FINDINGS Aortic arch: Normal aortic arch and proximal great vessels. Surgical clips right supraclavicular region, presumably from repair of right subclavian artery injury seen on the prior CTA. Right carotid system: Normal right carotid system without stenosis or dissection Left carotid system: Normal left carotid system without stenosis or dissection Vertebral arteries: Right vertebral dominant and widely patent to the basilar. Left vertebral artery is very small and occludes distally at the C1 level. This is chronic and unchanged from CT 09/14/2010. Skeleton: Mild disc degeneration and spurring at C5-6. Other neck: Chronic fracture right clavicle with plate fixation. Upper chest: Lung apices clear. Review of the MIP images confirms the above findings CTA HEAD FINDINGS Anterior circulation: Cavernous carotid widely patent bilaterally. Anterior and middle cerebral arteries widely patent bilaterally without stenosis. Posterior circulation: Right  vertebral artery supplies the basilar. There is probable retrograde flow in the distal left vertebral artery supplying the left PICA. There is occlusion of the left vertebral artery at C1. Basilar widely patent. Superior cerebellar and posterior cerebral arteries patent bilaterally. Fetal origin posterior cerebral artery bilaterally. 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 Right vertebral artery widely patent. Both carotid arteries widely patent. No significant intracranial stenosis. Negative for emergent large vessel occlusion. Electronically Signed   By: Franchot Gallo M.D.   On: 01/28/2017 13:11   Ct Head Wo Contrast  Result Date: 01/27/2017 CLINICAL DATA:  52 year old female with history of visual changes lasting for few minutes 20 minutes ago. Numbness and tingling in the lips and finger. EXAM: CT HEAD WITHOUT CONTRAST TECHNIQUE: Contiguous axial images were obtained from the base of the skull through the vertex without intravenous contrast. COMPARISON:  Head CT 02/25/2010. FINDINGS: Brain: No evidence of acute infarction, hemorrhage, hydrocephalus, extra-axial collection or mass lesion/mass effect. Vascular: No hyperdense vessel or unexpected calcification. Skull: Normal. Negative for fracture or focal lesion. Sinuses/Orbits: No acute finding. Other: None. IMPRESSION: 1. No acute intracranial abnormalities. The appearance of the brain is normal. Electronically Signed   By: Vinnie Langton M.D.   On: 01/27/2017 20:51   Ct Angio Neck W Or Wo Contrast  Result Date: 01/28/2017 CLINICAL DATA:  Stroke EXAM: CT ANGIOGRAPHY HEAD AND NECK TECHNIQUE: Multidetector CT imaging of the head and neck was performed using the standard protocol during bolus administration of intravenous contrast. Multiplanar CT image reconstructions and MIPs were obtained to evaluate the vascular anatomy. Carotid stenosis measurements (when applicable) are obtained utilizing NASCET criteria, using the distal internal carotid diameter as the denominator. CONTRAST:  <See Chart> ISOVUE-370 IOPAMIDOL (ISOVUE-370) INJECTION  76% COMPARISON:  MRI 01/28/2017, CT 02/24/2010 FINDINGS: CT HEAD FINDINGS Brain: Chronic encephalomalacia left posterolateral temporal lobe compatible with prior traumatic brain injury. No acute infarct or hemorrhage or mass. Ventricle size is normal. Vascular: Negative for hyperdense vessel Skull: Chronic left temporal fracture unchanged from prior studies Sinuses: Minimal mucosal edema paranasal sinuses. Orbits: Normal Review of the MIP images confirms the above findings CTA NECK FINDINGS Aortic arch: Normal aortic arch and proximal great vessels. Surgical clips right supraclavicular region, presumably from repair of right subclavian artery injury seen on the prior CTA. Right carotid system: Normal right carotid system without stenosis or dissection Left carotid system: Normal left carotid system without stenosis or dissection Vertebral arteries: Right vertebral dominant and widely patent to the basilar. Left vertebral artery is very small and occludes distally at the C1 level. This is chronic and unchanged from CT 09/14/2010. Skeleton: Mild disc degeneration and spurring at C5-6. Other neck: Chronic fracture right clavicle with plate fixation. Upper chest: Lung apices clear. Review of the MIP images confirms the above findings CTA HEAD FINDINGS Anterior circulation: Cavernous carotid widely patent bilaterally. Anterior and middle cerebral arteries widely patent bilaterally without stenosis. Posterior circulation: Right vertebral artery supplies the basilar. There is probable retrograde flow in the distal left vertebral artery supplying the left PICA. There is occlusion of the left vertebral artery at C1. Basilar widely patent. Superior cerebellar and posterior cerebral arteries patent bilaterally. Fetal origin posterior cerebral artery bilaterally. 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 Right vertebral artery widely patent. Both carotid arteries widely patent. No significant intracranial stenosis. Negative for emergent large vessel occlusion. Electronically Signed   By: Franchot Gallo M.D.   On: 01/28/2017 13:11   Mr Brain Wo Contrast  Result Date: 01/28/2017 CLINICAL DATA:  Blurry vision EXAM: MRI HEAD WITHOUT CONTRAST MRA HEAD WITHOUT CONTRAST TECHNIQUE: Multiplanar, multiecho pulse sequences of the brain and surrounding structures were obtained without intravenous contrast. Angiographic images of the head were obtained using MRA technique without contrast. COMPARISON:  Head CT 01/27/2017 FINDINGS: MRI HEAD FINDINGS Brain: The midline structures are normal. No focal diffusion restriction to indicate acute infarct. No intraparenchymal hemorrhage. Area of encephalomalacia in the left temporal lobe is unchanged. The parenchymal signal of the brain is otherwise normal. No mass lesion. No chronic microhemorrhage or cerebral amyloid angiopathy. No hydrocephalus, age advanced atrophy or lobar predominant volume loss. No dural abnormality or extra-axial collection. Skull and upper cervical spine: The visualized skull base, calvarium, upper cervical spine and extracranial soft tissues are normal. Sinuses/Orbits: No fluid levels or advanced mucosal thickening. No mastoid effusion. Normal orbits. MRA HEAD FINDINGS Intracranial internal carotid arteries: Normal. Anterior cerebral arteries: Normal. Middle cerebral arteries: Normal. Posterior communicating arteries: Present bilaterally. Posterior cerebral arteries: Normal. Basilar artery: Normal. Vertebral arteries: Left dominant. Normal. Superior cerebellar arteries: Normal. Anterior inferior cerebellar arteries: Normal on the right. Not clearly seen on the left.  Posterior inferior cerebellar arteries: Normal. IMPRESSION: 1. No acute intracranial abnormality. 2. No emergent large vessel occlusion or high-grade intracranial stenosis. 3. 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 Electronically Signed   By: Ulyses Jarred M.D.   On: 01/28/2017 06:18   Mr Jodene Nam Head Wo Contrast  Result Date: 01/28/2017 CLINICAL DATA:  Blurry vision EXAM: MRI HEAD WITHOUT CONTRAST MRA HEAD WITHOUT CONTRAST TECHNIQUE: Multiplanar, multiecho pulse sequences of the brain and surrounding structures were obtained without intravenous contrast. Angiographic images of the head were obtained using MRA technique without contrast. COMPARISON:  Head CT 01/27/2017 FINDINGS: MRI HEAD FINDINGS Brain: The midline structures are normal. No focal diffusion restriction to indicate acute infarct. No intraparenchymal hemorrhage. Area of encephalomalacia in the left temporal lobe is unchanged. The parenchymal signal of the brain is otherwise normal. No mass lesion. No chronic microhemorrhage or cerebral amyloid angiopathy. No hydrocephalus, age advanced atrophy or lobar predominant volume loss. No dural abnormality or extra-axial collection. Skull and upper cervical spine: The visualized skull base, calvarium, upper cervical spine and extracranial soft tissues are normal. Sinuses/Orbits: No fluid levels or advanced mucosal thickening. No mastoid effusion. Normal orbits. MRA HEAD FINDINGS Intracranial internal carotid arteries: Normal. Anterior cerebral arteries: Normal. Middle cerebral arteries: Normal. Posterior communicating arteries: Present bilaterally. Posterior cerebral arteries: Normal. Basilar artery: Normal. Vertebral arteries: Left dominant. Normal. Superior cerebellar arteries: Normal. Anterior inferior cerebellar arteries: Normal on the right. Not clearly seen on the left. Posterior inferior cerebellar arteries: Normal. IMPRESSION: 1. No acute intracranial abnormality. 2. No  emergent large vessel occlusion or high-grade intracranial stenosis. 3. 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 Electronically Signed   By: Ulyses Jarred M.D.   On: 01/28/2017 06:18      Subjective: Symptoms as described above. No recurrence of symptoms since hospitalization.  Anxious to go home. Has never had these kind of symptoms in the past. Rarely has had migraine headaches but this was different.   Discharge Exam:  Vitals:   01/28/17 0042 01/28/17 0245 01/28/17 1006 01/28/17 1417  BP: 134/79 129/76 (!) 96/52 (!) 104/59  Pulse: (!) 58 61 66 74  Resp: 18 18 17 19   Temp: 98.6 F (37 C) 98.4 F (36.9 C) 98.6 F (37 C) 98.4 F (36.9 C)  TempSrc: Oral Oral Oral Oral  SpO2: 98% 99% 97% 99%  Weight: 65.3 kg (144 lb)     Height: 5\' 8"  (1.727 m)       General: pleasant young female, moderately built and nourished lying comfortably supine in bed.  Cardiovascular: S1 & S2 heard, RRR, S1/S2 +. No murmurs, rubs, gallops or clicks. No JVD or pedal edema. telemetry personally reviewed: SR.  Respiratory: Clear to auscultation without wheezing, rhonchi or crackles. No increased work of breathing. Abdominal:  Non distended, non tender & soft. No organomegaly or masses appreciated. Normal bowel sounds heard. CNS: Alert and oriented. No focal deficits. Extremities: no edema, no cyanosis    The results of significant diagnostics from this hospitalization (including imaging, microbiology, ancillary and laboratory) are listed below for reference.       Labs: CBC: Recent Labs  Lab 01/27/17 2017 01/28/17 0649  WBC 9.2 6.7  NEUTROABS 6.9  --   HGB 12.2 12.1  HCT 36.5 38.0  MCV 83.9 84.4  PLT 235 893   Basic Metabolic Panel: Recent Labs  Lab 01/27/17 2017 01/28/17 0649  NA 138 140  K 3.5 3.1*  CL 105 108  CO2 26 25  GLUCOSE 118* 77  BUN 13 7  CREATININE 0.70 0.64  CALCIUM 9.3 8.4*   Liver Function Tests: Recent Labs  Lab 01/27/17 2017  01/28/17 0649  AST 21 19  ALT 15 15  ALKPHOS 50 45  BILITOT 0.4 0.7  PROT 6.7 5.7*  ALBUMIN 4.0 3.6   Cardiac Enzymes: Recent Labs  Lab 01/27/17 2017  TROPONINI <0.03   Hgb A1c Recent Labs    01/28/17 0649  HGBA1C 5.3   Lipid Profile Recent Labs    01/28/17 0649  CHOL 137  HDL 58  LDLCALC 62  TRIG 84  CHOLHDL 2.4     Time coordinating discharge: Over 30 minutes  SIGNED:  Vernell Leep, MD, FACP, [facility]. [facility] Pager [phone]  If 7PM-7AM, please contact night-coverage [email or web address] Password [email or web address] 01/28/2017, 6:30 PM

## 2017-01-29 LAB — HIV ANTIBODY (ROUTINE TESTING W REFLEX): HIV SCREEN 4TH GENERATION: NONREACTIVE

## 2017-02-02 DIAGNOSIS — F4320 Adjustment disorder, unspecified: Secondary | ICD-10-CM | POA: Diagnosis not present

## 2017-02-04 DIAGNOSIS — H539 Unspecified visual disturbance: Secondary | ICD-10-CM | POA: Diagnosis not present

## 2017-02-19 DIAGNOSIS — D171 Benign lipomatous neoplasm of skin and subcutaneous tissue of trunk: Secondary | ICD-10-CM

## 2017-02-19 HISTORY — DX: Benign lipomatous neoplasm of skin and subcutaneous tissue of trunk: D17.1

## 2017-02-23 DIAGNOSIS — F4320 Adjustment disorder, unspecified: Secondary | ICD-10-CM | POA: Diagnosis not present

## 2017-02-24 ENCOUNTER — Other Ambulatory Visit: Payer: Self-pay | Admitting: Surgery

## 2017-02-24 DIAGNOSIS — R229 Localized swelling, mass and lump, unspecified: Secondary | ICD-10-CM | POA: Diagnosis not present

## 2017-02-26 ENCOUNTER — Encounter: Payer: Self-pay | Admitting: Neurology

## 2017-02-26 ENCOUNTER — Ambulatory Visit: Payer: BLUE CROSS/BLUE SHIELD | Admitting: Neurology

## 2017-02-26 VITALS — BP 90/56 | HR 63 | Ht 68.0 in | Wt 144.0 lb

## 2017-02-26 DIAGNOSIS — G43109 Migraine with aura, not intractable, without status migrainosus: Secondary | ICD-10-CM | POA: Diagnosis not present

## 2017-02-26 NOTE — Progress Notes (Signed)
[facility]    Provider:  Dr Jaynee Eagles Referring Provider: Cari Caraway, MD Primary Care Physician:  Cari Caraway, MD  CC: Transient visual abnormality  HPI:  Tamara Williamson is a 52 y.o. female here as a referral from Dr. Addison Lank for transient visual abnormality.  She was diagnosed with probable TIA and started on aspirin.She has a history of migraine aura. No pain. Sleep helps. She had a stressful day 1/10 of this year, she went to work out, she ate cereal and she was watchingTV, the screen started vibrating, Then it looked like she was looking through a kaleidoscope, only with both eyes opeb, looked like broken glass. Lasted 15 minutes. Not dizzy. No weakness. No facial droop. No seizure like activity. No other focal neurologic deficits, associated symptoms, inciting events or modifiable factors.  Reviewed notes, labs and imaging from outside physicians, which showed:   Reviewed hospital notes.  Patient had a fall back in 2011 which resulted in clavicular fracture and also a right subclavian injury which needed to be repaired just distal to the right takeoff of the right vertebral artery.  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  Patient noticed difficulty since his procedure.  She does note that she had migraines in the past which has caused wavy lines in her left visual field.  She is never had a headache with a migraine.  Patient had an episode of vision changes, like she was looking through a kaleidoscope with fracture pieces of vision.  There was no spinning, dizziness, loss of vision or double vision.  Lasted 15 minutes and then resolved.  Also had paresthesias in the face.  No weakness.  NIH stroke scale was 0.  MRI brain (personally reviewed images and agree with the following):  IMPRESSION: 1. No acute intracranial abnormality. 2. No emergent large vessel occlusion or high-grade intracranial stenosis. 3.  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   Review of Systems: Patient complains of symptoms per HPI as well as the following symptoms: vision changes. Pertinent negatives and positives per HPI. All others negative.   Social History   Socioeconomic History  . Marital status: Married    Spouse name: Not on file  . Number of children: Not on file  . Years of education: Not on file  . Highest education level: Not on file  Social Needs  . Financial resource strain: Not on file  . Food insecurity - worry: Not on file  . Food insecurity - inability: Not on file  . Transportation needs - medical: Not on file  . Transportation needs - non-medical: Not on file  Occupational History  . Not on file  Tobacco Use  . Smoking status: Former Research scientist (life sciences)  . Smokeless tobacco: Never Used  . Tobacco comment: quit 25-30 years   Substance and Sexual Activity  . Alcohol use: Yes    Frequency: Never    Comment: occaisionally  . Drug use: No  . Sexual activity: Not on file  Other Topics Concern  . Not on file  Social History Narrative  . Not on file    Family History  Problem Relation Age of Onset  . Hypertension Mother   . Hyperlipidemia Mother   . Kidney disease Mother   . Heart disease Mother   . Depression Father        Major depression  . Kidney Stones Father     Past Medical History:  Diagnosis Date  . Acne   . Allergic rhinitis   . Anxiety    with sleep disorder  . Cold intolerance    Feel her low body fat level is a major contributor to cold extremities (especially since symptoms resolve in warm temperatures). No evidence for Raynaud's phenomena.     . Ecchymosis   . 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 Kidney stone   . Major depressive disorder   . Other acne   . Other malaise and fatigue   . TBI (traumatic brain injury) ([place])    02/25/2000    Past Surgical History:  Procedure Laterality Date  . WISDOM TOOTH  EXTRACTION  1989    Current Outpatient Medications  Medication Sig Dispense Refill  . aspirin EC 81 MG tablet Take 1 tablet (81 mg total) by mouth daily. 30 tablet 0  . cetirizine (ZYRTEC) 10 MG tablet Take 10 mg by mouth daily.    . eszopiclone (LUNESTA) 2 MG TABS tablet Take 2 mg by mouth at bedtime as needed. Eszopiclone 2 MG Tablet 1 tablet immediately before bedtime Once a day     . mometasone (NASONEX) 50 MCG/ACT nasal spray Place 2 sprays into the nose as needed.     . sertraline (ZOLOFT) 100 MG tablet Take 100 mg by mouth daily.     No current facility-administered medications for this visit.     Allergies as of 02/26/2017 - Review Complete 02/26/2017  Allergen Reaction Noted  . Ambien [zolpidem]  08/17/2013  . Doxycycline calcium Hives 08/17/2013  . Other  08/17/2013    Vitals: BP (!) 90/56   Pulse 63   Ht 5\' 8"  (1.727 m)   Wt 144 lb (65.3 kg)   BMI 21.90 kg/m  Last Weight:  Wt Readings from Last 1 Encounters:  02/26/17 144 lb (65.3 kg)   Last Height:   Ht Readings from Last 1 Encounters:  02/26/17 5\' 8"  (1.727 m)   Physical exam: Exam: Gen: NAD, conversant, well nourised, well groomed                     CV: RRR, no MRG. No Carotid Bruits. No peripheral edema, warm, nontender Eyes: Conjunctivae clear without exudates or hemorrhage  Neuro: Detailed Neurologic Exam  Speech:    Speech is normal; fluent and spontaneous with normal comprehension.  Cognition:    The patient is oriented to person, place, and time;     recent and remote memory intact;     language fluent;     normal attention, concentration,     fund of knowledge Cranial Nerves:    The pupils are equal, round, and reactive to light. The fundi are normal and spontaneous venous pulsations are present. Visual fields are full to finger confrontation. Extraocular movements are intact. Trigeminal sensation is intact and the muscles of mastication are normal. The face is symmetric. The palate elevates  in the midline. Hearing intact. Voice is normal. Shoulder shrug is normal. The tongue has normal motion without fasciculations.   Coordination:    Normal finger to nose and heel to shin. Normal rapid alternating movements.   Gait:    Heel-toe and tandem gait are normal.   Motor Observation:    No asymmetry, no atrophy, and no involuntary movements noted. Tone:    Normal muscle tone.    Posture:    Posture is normal. normal erect    Strength:  Strength is V/V in the upper and lower limbs.      Sensation: intact to LT     Reflex Exam:  DTR's:    Deep tendon reflexes in the upper and lower extremities are normal bilaterally.   Toes:    The toes are downgoing bilaterally.   Clonus:    Clonus is absent.       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 Very unlikely TIA or seizure.  - Discussed with patient, reassure her. - Can continue ASA 81mg   - If Migraine aura increases can suggest magnesium daily. Lamictal is also excellent for migraine aura prevention (but not for head pain). If aura increases in length can try imitrex injectable. At this time will monitor clinicallay. - Discussed: There is increased risk for stroke in women with migraine with aura and a  Contraindication for the combined contraceptive pill for use by women who have migraine with aura, which is in line with World Health Organisation recommendations. The risk for women with migraine without aura is lower and other risk factors like smoking are far more likely to increase stroke risk than migraine. There is a recommendation for no smoking and for the use of low estrogen or progestogen only pills particularly for women with migraine with aura. It is important however that women with migraine who are taking the pill do not decide to suddenly stop taking it without  discussing this with their doctor. Please discuss with her OB/GYN.  Discussed: To prevent or relieve headaches, try the following: Cool Compress. Lie down and place a cool compress on your head.  Avoid headache triggers. If certain foods or odors seem to have triggered your migraines in the past, avoid them. A headache diary might help you identify triggers.  Include physical activity in your daily routine. Try a daily walk or other moderate aerobic exercise.  Manage stress. Find healthy ways to cope with the stressors, such as delegating tasks on your to-do list.  Practice relaxation techniques. Try deep breathing, yoga, massage and visualization.  Eat regularly. Eating regularly scheduled meals and maintaining a healthy diet might help prevent headaches. Also, drink plenty of fluids.  Follow a regular sleep schedule. Sleep deprivation might contribute to headaches Consider biofeedback. With this mind-body technique, you learn to control certain bodily functions - such as muscle tension, heart rate and blood pressure - to prevent headaches or reduce headache pain.    Proceed to emergency room if you experience new or worsening symptoms or symptoms do not resolve, if you have new neurologic symptoms or if headache is severe, or for any concerning symptom.   Provided education and documentation from American headache Society toolbox including articles on: chronic migraine medication overuse headache, chronic migraines, prevention of migraines, behavioral and other nonpharmacologic treatments for headache.   Sarina Ill, MD  [facility] [address] [place], [place] [address]  Phone [phone] Fax [phone]

## 2017-02-26 NOTE — Patient Instructions (Signed)
Migraine Headache A migraine headache is an intense, throbbing pain on one side or both sides of the head. Migraines may also cause other symptoms, such as nausea, vomiting, and sensitivity to light and noise. What are the causes? Doing or taking certain things may also trigger migraines, such as:  Alcohol.  Smoking.  Medicines, such as: ? Medicine used to treat chest pain (nitroglycerine). ? Birth control pills. ? Estrogen pills. ? Certain blood pressure medicines.  Aged cheeses, chocolate, or caffeine.  Foods or drinks that contain nitrates, glutamate, aspartame, or tyramine.  Physical activity.  Other things that may trigger a migraine include:  Menstruation.  Pregnancy.  Hunger.  Stress, lack of sleep, too much sleep, or fatigue.  Weather changes.  What increases the risk? The following factors may make you more likely to experience migraine headaches:  Age. Risk increases with age.  Family history of migraine headaches.  Being Caucasian.  Depression and anxiety.  Obesity.  Being a woman.  Having a hole in the heart (patent foramen ovale) or other heart problems.  What are the signs or symptoms? The main symptom of this condition is pulsating or throbbing pain. Pain may:  Happen in any area of the head, such as on one side or both sides.  Interfere with daily activities.  Get worse with physical activity.  Get worse with exposure to bright lights or loud noises.  Other symptoms may include:  Nausea.  Vomiting.  Dizziness.  General sensitivity to bright lights, loud noises, or smells.  Before you get a migraine, you may get warning signs that a migraine is developing (aura). An aura may include:  Seeing flashing lights or having blind spots.  Seeing bright spots, halos, or zigzag lines.  Having tunnel vision or blurred vision.  Having numbness or a tingling feeling.  Having trouble talking.  Having muscle weakness.  How is this  diagnosed? A migraine headache can be diagnosed based on:  Your symptoms.  A physical exam.  Tests, such as CT scan or MRI of the head. These imaging tests can help rule out other causes of headaches.  Taking fluid from the spine (lumbar puncture) and analyzing it (cerebrospinal fluid analysis, or CSF analysis).  How is this treated? A migraine headache is usually treated with medicines that:  Relieve pain.  Relieve nausea.  Prevent migraines from coming back.  Treatment may also include:  Acupuncture.  Lifestyle changes like avoiding foods that trigger migraines.  Follow these instructions at home: Medicines  Take over-the-counter and prescription medicines only as told by your health care provider.  Do not drive or use heavy machinery while taking prescription pain medicine.  To prevent or treat constipation while you are taking prescription pain medicine, your health care provider may recommend that you: ? Drink enough fluid to keep your urine clear or pale yellow. ? Take over-the-counter or prescription medicines. ? Eat foods that are high in fiber, such as fresh fruits and vegetables, whole grains, and beans. ? Limit foods that are high in fat and processed sugars, such as fried and sweet foods. Lifestyle  Avoid alcohol use.  Do not use any products that contain nicotine or tobacco, such as cigarettes and e-cigarettes. If you need help quitting, ask your health care provider.  Get at least 8 hours of sleep every night.  Limit your stress. General instructions   Keep a journal to find out what may trigger your migraine headaches. For example, write down: ? What you eat and   drink. ? How much sleep you get. ? Any change to your diet or medicines.  If you have a migraine: ? Avoid things that make your symptoms worse, such as bright lights. ? It may help to lie down in a dark, quiet room. ? Do not drive or use heavy machinery. ? Ask your health care provider  what activities are safe for you while you are experiencing symptoms.  Keep all follow-up visits as told by your health care provider. This is important. Contact a health care provider if:  You develop symptoms that are different or more severe than your usual migraine symptoms. Get help right away if:  Your migraine becomes severe.  You have a fever.  You have a stiff neck.  You have vision loss.  Your muscles feel weak or like you cannot control them.  You start to lose your balance often.  You develop trouble walking.  You faint. This information is not intended to replace advice given to you by your health care provider. Make sure you discuss any questions you have with your health care provider. Document Released: 01/05/2005 Document Revised: 07/26/2015 Document Reviewed: 06/24/2015 Elsevier Interactive Patient Education  2017 Elsevier Inc.   

## 2017-02-28 ENCOUNTER — Encounter: Payer: Self-pay | Admitting: Neurology

## 2017-02-28 DIAGNOSIS — G43109 Migraine with aura, not intractable, without status migrainosus: Secondary | ICD-10-CM | POA: Insufficient documentation

## 2017-03-09 DIAGNOSIS — F4320 Adjustment disorder, unspecified: Secondary | ICD-10-CM | POA: Diagnosis not present

## 2017-03-12 ENCOUNTER — Other Ambulatory Visit: Payer: Self-pay

## 2017-03-12 ENCOUNTER — Encounter (HOSPITAL_BASED_OUTPATIENT_CLINIC_OR_DEPARTMENT_OTHER): Payer: Self-pay | Admitting: *Deleted

## 2017-03-16 DIAGNOSIS — F4320 Adjustment disorder, unspecified: Secondary | ICD-10-CM | POA: Diagnosis not present

## 2017-03-16 NOTE — H&P (Signed)
Tamara Williamson Documented: 02/24/2017 11:20 AM Location: [facility] Patient #: [id number] DOB: 1965-04-06 Divorced / Language: Cleophus Molt / Race: White Female   History of Present Illness (Billey Wojciak A. Ninfa Linden MD; 02/24/2017 11:35 AM) The patient is a 52 year old female who presents with a complaint of Mass. This is a pleasant female referred by Dr. Cari Caraway for evaluation of a small mass on the left buttock. The patient just noticed this in the mirror. It causes no discomfort. It was visually present. She has no previous history of similar masses. She is otherwise without complaints.   Past Surgical History Illene Regulus, CMA; 02/24/2017 11:20 AM) Oral Surgery   Diagnostic Studies History Lars Mage Spillers, CMA; 02/24/2017 11:20 AM) Colonoscopy  never Mammogram  1-3 years ago Pap Smear  1-5 years ago  Allergies Lars Mage Spillers, CMA; 02/24/2017 11:29 AM) Sulfa Drugs  Allergies Reconciled   Medication History (Alisha Spillers, CMA; 02/24/2017 11:28 AM) Aspirin Low Dose (81MG  Tablet DR, Oral) Active. Sertraline HCl (100MG  Tablet, Oral) Active. Medications Reconciled  Social History Illene Regulus, CMA; 02/24/2017 11:20 AM) Alcohol use  Occasional alcohol use. Caffeine use  Carbonated beverages, Coffee. Illicit drug use  Remotely quit drug use. Tobacco use  Former smoker.  Family History Illene Regulus, CMA; 02/24/2017 11:20 AM) Depression  Brother, Father, Mother. Heart Disease  Mother. Heart disease in female family member before age 37  Hypertension  Mother. Kidney Disease  Mother. Migraine Headache  Brother.  Pregnancy / Birth History Illene Regulus, CMA; 02/24/2017 11:20 AM) Age at menarche  89 years. Contraceptive History  Oral contraceptives. Gravida  1 Maternal age  >30 Para  1 Regular periods   Other Problems Illene Regulus, CMA; 02/24/2017 11:20 AM) Anxiety Disorder  Cerebrovascular Accident  Depression  General  anesthesia - complications  Heart murmur  Kidney Stone     Review of Systems Lars Mage Spillers CMA; 02/24/2017 11:20 AM) General Not Present- Appetite Loss, Chills, Fatigue, Fever, Night Sweats, Weight Gain and Weight Loss. HEENT Not Present- Earache, Hearing Loss, Hoarseness, Nose Bleed, Oral Ulcers, Ringing in the Ears, Seasonal Allergies, Sinus Pain, Sore Throat, Visual Disturbances, Wears glasses/contact lenses and Yellow Eyes. Respiratory Not Present- Bloody sputum, Chronic Cough, Difficulty Breathing, Snoring and Wheezing. Breast Not Present- Breast Mass, Breast Pain, Nipple Discharge and Skin Changes. Cardiovascular Not Present- Chest Pain, Difficulty Breathing Lying Down, Leg Cramps, Palpitations, Rapid Heart Rate, Shortness of Breath and Swelling of Extremities. Gastrointestinal Not Present- Abdominal Pain, Bloating, Bloody Stool, Change in Bowel Habits, Chronic diarrhea, Constipation, Difficulty Swallowing, Excessive gas, Gets full quickly at meals, Hemorrhoids, Indigestion, Nausea, Rectal Pain and Vomiting. Female Genitourinary Not Present- Frequency, Nocturia, Painful Urination, Pelvic Pain and Urgency. Musculoskeletal Not Present- Back Pain, Joint Pain, Joint Stiffness, Muscle Pain, Muscle Weakness and Swelling of Extremities. Neurological Not Present- Decreased Memory, Fainting, Headaches, Numbness, Seizures, Tingling, Tremor, Trouble walking and Weakness. Psychiatric Present- Anxiety and Depression. Not Present- Bipolar, Change in Sleep Pattern, Fearful and Frequent crying. Endocrine Not Present- Cold Intolerance, Excessive Hunger, Hair Changes, Heat Intolerance, Hot flashes and New Diabetes. Hematology Not Present- Blood Thinners, Easy Bruising, Excessive bleeding, Gland problems, HIV and Persistent Infections.  Vitals (Alisha Spillers CMA; 02/24/2017 11:22 AM) 02/24/2017 11:22 AM Weight: 145 lb Height: 68.5in Body Surface Area: 1.79 m Body Mass Index: 21.73 kg/m  Pulse:  64 (Regular)  BP: 102/62 (Sitting, Left Arm, Standard)       Physical Exam (Jadier Rockers A. Ninfa Linden MD; 02/24/2017 11:35 AM) The physical exam findings are as follows: Note:Generally,  she is well in appearance Lungs are clear bilaterally Cardiovascular regular rate and rhythm Evaluation of her buttock shows a soft mass is actually visible skin but does go deeper. It is mobile with minimal firmness. There is no erythema of the skin. It is nontender and nonpulsatile    Assessment & Plan (Shardee Dieu A. Ninfa Linden MD; 02/24/2017 11:36 AM) MASS (R22.9) Impression: I'm uncertain of the etiology of this mass as it does involve the skin. This could represent a lipoma although I cannot completely rule out a dermoid tumor. Surgical excision of the mass is recommended for his logic evaluation to rule out a malignancy. I discussed this with her in detail. We discussed the risks of surgery which includes but is not limited to bleeding, infection, recurrence, cardiopulmonary issues, etc. We discussed postoperative recovery as well. She understands and agrees to proceed with surgery

## 2017-03-17 ENCOUNTER — Ambulatory Visit (HOSPITAL_BASED_OUTPATIENT_CLINIC_OR_DEPARTMENT_OTHER)
Admission: RE | Admit: 2017-03-17 | Discharge: 2017-03-17 | Disposition: A | Discharge status: Home / Self Care | Payer: BLUE CROSS/BLUE SHIELD | Source: Ambulatory Visit | Attending: Surgery | Admitting: Surgery

## 2017-03-17 ENCOUNTER — Ambulatory Visit (HOSPITAL_BASED_OUTPATIENT_CLINIC_OR_DEPARTMENT_OTHER): Payer: BLUE CROSS/BLUE SHIELD | Admitting: Certified Registered"

## 2017-03-17 ENCOUNTER — Encounter (HOSPITAL_BASED_OUTPATIENT_CLINIC_OR_DEPARTMENT_OTHER): Payer: Self-pay | Admitting: Certified Registered"

## 2017-03-17 ENCOUNTER — Encounter (HOSPITAL_BASED_OUTPATIENT_CLINIC_OR_DEPARTMENT_OTHER)
Admission: RE | Disposition: A | Discharge status: Home / Self Care | Payer: Self-pay | Source: Ambulatory Visit | Attending: Surgery

## 2017-03-17 DIAGNOSIS — G43109 Migraine with aura, not intractable, without status migrainosus: Secondary | ICD-10-CM | POA: Diagnosis not present

## 2017-03-17 DIAGNOSIS — Z881 Allergy status to other antibiotic agents status: Secondary | ICD-10-CM | POA: Diagnosis not present

## 2017-03-17 DIAGNOSIS — Z87891 Personal history of nicotine dependence: Secondary | ICD-10-CM | POA: Diagnosis not present

## 2017-03-17 DIAGNOSIS — F419 Anxiety disorder, unspecified: Secondary | ICD-10-CM | POA: Insufficient documentation

## 2017-03-17 DIAGNOSIS — F329 Major depressive disorder, single episode, unspecified: Secondary | ICD-10-CM | POA: Diagnosis not present

## 2017-03-17 DIAGNOSIS — Z888 Allergy status to other drugs, medicaments and biological substances status: Secondary | ICD-10-CM | POA: Insufficient documentation

## 2017-03-17 DIAGNOSIS — Z882 Allergy status to sulfonamides status: Secondary | ICD-10-CM | POA: Diagnosis not present

## 2017-03-17 DIAGNOSIS — R011 Cardiac murmur, unspecified: Secondary | ICD-10-CM | POA: Insufficient documentation

## 2017-03-17 DIAGNOSIS — M79671 Pain in right foot: Secondary | ICD-10-CM | POA: Diagnosis not present

## 2017-03-17 DIAGNOSIS — R229 Localized swelling, mass and lump, unspecified: Secondary | ICD-10-CM | POA: Diagnosis not present

## 2017-03-17 DIAGNOSIS — R222 Localized swelling, mass and lump, trunk: Secondary | ICD-10-CM | POA: Insufficient documentation

## 2017-03-17 DIAGNOSIS — Z79899 Other long term (current) drug therapy: Secondary | ICD-10-CM | POA: Diagnosis not present

## 2017-03-17 DIAGNOSIS — Z8673 Personal history of transient ischemic attack (TIA), and cerebral infarction without residual deficits: Secondary | ICD-10-CM | POA: Diagnosis not present

## 2017-03-17 HISTORY — DX: Major depressive disorder, single episode, unspecified: F32.9

## 2017-03-17 HISTORY — PX: MASS EXCISION: SHX2000

## 2017-03-17 HISTORY — DX: Personal history of traumatic brain injury: Z87.820

## 2017-03-17 HISTORY — DX: Dental restoration status: Z98.811

## 2017-03-17 HISTORY — DX: Personal history of urinary calculi: Z87.442

## 2017-03-17 HISTORY — DX: Benign lipomatous neoplasm of skin and subcutaneous tissue of trunk: D17.1

## 2017-03-17 HISTORY — DX: Migraine, unspecified, not intractable, without status migrainosus: G43.909

## 2017-03-17 HISTORY — DX: Depression, unspecified: F32.A

## 2017-03-17 HISTORY — DX: Cardiac murmur, unspecified: R01.1

## 2017-03-17 HISTORY — DX: Family history of other specified conditions: Z84.89

## 2017-03-17 SURGERY — EXCISION MASS
Anesthesia: General | Site: Buttocks

## 2017-03-17 MED ORDER — TRAMADOL HCL 50 MG PO TABS: 50.0000 mg | ORAL_TABLET | Freq: Four times a day (QID) | ORAL | 0 refills | Status: DC | PRN

## 2017-03-17 MED ORDER — CHLORHEXIDINE GLUCONATE CLOTH 2 % EX PADS: 6.0000 | MEDICATED_PAD | Freq: Once | CUTANEOUS | Status: DC

## 2017-03-17 MED ORDER — HYDROMORPHONE HCL 1 MG/ML IJ SOLN: 0.2500 mg | INTRAMUSCULAR | Status: DC | PRN

## 2017-03-17 MED ORDER — OXYCODONE HCL 5 MG PO TABS: 5.0000 mg | ORAL_TABLET | Freq: Once | ORAL | Status: DC | PRN

## 2017-03-17 MED ORDER — PROMETHAZINE HCL 25 MG/ML IJ SOLN: 6.2500 mg | INTRAMUSCULAR | Status: DC | PRN

## 2017-03-17 MED ORDER — PROPOFOL 10 MG/ML IV BOLUS
INTRAVENOUS | Status: DC | PRN
  Administered 2017-03-17: 150 mg via INTRAVENOUS

## 2017-03-17 MED ORDER — ROCURONIUM 10MG/ML (10ML) SYRINGE FOR MEDFUSION PUMP - OPTIME
INTRAVENOUS | Status: DC | PRN
  Administered 2017-03-17: 20 mg via INTRAVENOUS

## 2017-03-17 MED ORDER — EPHEDRINE 5 MG/ML INJ
INTRAVENOUS | Status: AC
  Filled 2017-03-17: qty 20

## 2017-03-17 MED ORDER — SUGAMMADEX SODIUM 200 MG/2ML IV SOLN
INTRAVENOUS | Status: DC | PRN
  Administered 2017-03-17: 20 mg via INTRAVENOUS

## 2017-03-17 MED ORDER — CEFAZOLIN SODIUM-DEXTROSE 2-4 GM/100ML-% IV SOLN
2.0000 g | INTRAVENOUS | Status: AC
  Administered 2017-03-17: 2 g via INTRAVENOUS

## 2017-03-17 MED ORDER — MIDAZOLAM HCL 2 MG/2ML IJ SOLN
1.0000 mg | INTRAMUSCULAR | Status: DC | PRN
  Administered 2017-03-17: 2 mg via INTRAVENOUS

## 2017-03-17 MED ORDER — OXYCODONE HCL 5 MG/5ML PO SOLN: 5.0000 mg | Freq: Once | ORAL | Status: DC | PRN

## 2017-03-17 MED ORDER — FENTANYL CITRATE (PF) 100 MCG/2ML IJ SOLN
INTRAMUSCULAR | Status: AC
  Filled 2017-03-17: qty 2

## 2017-03-17 MED ORDER — FENTANYL CITRATE (PF) 100 MCG/2ML IJ SOLN
50.0000 ug | INTRAMUSCULAR | Status: DC | PRN
  Administered 2017-03-17: 50 ug via INTRAVENOUS

## 2017-03-17 MED ORDER — LIDOCAINE HCL (CARDIAC) 20 MG/ML IV SOLN
INTRAVENOUS | Status: DC | PRN
  Administered 2017-03-17: 60 mg via INTRAVENOUS

## 2017-03-17 MED ORDER — MIDAZOLAM HCL 2 MG/2ML IJ SOLN
INTRAMUSCULAR | Status: AC
  Filled 2017-03-17: qty 2

## 2017-03-17 MED ORDER — SCOPOLAMINE 1 MG/3DAYS TD PT72: 1.0000 | MEDICATED_PATCH | Freq: Once | TRANSDERMAL | Status: DC | PRN

## 2017-03-17 MED ORDER — BUPIVACAINE-EPINEPHRINE 0.5% -1:200000 IJ SOLN
INTRAMUSCULAR | Status: DC | PRN
  Administered 2017-03-17: 20 mL

## 2017-03-17 MED ORDER — KETOROLAC TROMETHAMINE 30 MG/ML IJ SOLN
INTRAMUSCULAR | Status: DC | PRN
  Administered 2017-03-17: 30 mg via INTRAVENOUS

## 2017-03-17 MED ORDER — DEXAMETHASONE SODIUM PHOSPHATE 4 MG/ML IJ SOLN
INTRAMUSCULAR | Status: DC | PRN
  Administered 2017-03-17: 10 mg via INTRAVENOUS

## 2017-03-17 MED ORDER — LACTATED RINGERS IV SOLN
INTRAVENOUS | Status: DC
  Administered 2017-03-17 (×2): via INTRAVENOUS

## 2017-03-17 MED ORDER — KETOROLAC TROMETHAMINE 30 MG/ML IJ SOLN
INTRAMUSCULAR | Status: AC
  Filled 2017-03-17: qty 1

## 2017-03-17 MED ORDER — ONDANSETRON HCL 4 MG/2ML IJ SOLN
INTRAMUSCULAR | Status: DC | PRN
  Administered 2017-03-17: 4 mg via INTRAVENOUS

## 2017-03-17 SURGICAL SUPPLY — 37 items
BLADE CLIPPER SURG (BLADE) IMPLANT
BLADE HEX COATED 2.75 (ELECTRODE) ×2 IMPLANT
BLADE SURG 15 STRL LF DISP TIS (BLADE) ×1 IMPLANT
BLADE SURG 15 STRL SS (BLADE) ×1
CANISTER SUCT 1200ML W/VALVE (MISCELLANEOUS) IMPLANT
CHLORAPREP W/TINT 26ML (MISCELLANEOUS) ×2 IMPLANT
COVER BACK TABLE 60X90IN (DRAPES) ×2 IMPLANT
COVER MAYO STAND STRL (DRAPES) ×2 IMPLANT
DECANTER SPIKE VIAL GLASS SM (MISCELLANEOUS) IMPLANT
DERMABOND ADVANCED (GAUZE/BANDAGES/DRESSINGS) ×1
DERMABOND ADVANCED .7 DNX12 (GAUZE/BANDAGES/DRESSINGS) ×1 IMPLANT
DRAPE LAPAROTOMY 100X72 PEDS (DRAPES) ×2 IMPLANT
DRAPE UTILITY XL STRL (DRAPES) ×2 IMPLANT
ELECT REM PT RETURN 9FT ADLT (ELECTROSURGICAL) ×2
ELECTRODE REM PT RTRN 9FT ADLT (ELECTROSURGICAL) ×1 IMPLANT
GLOVE SURG SIGNA 7.5 PF LTX (GLOVE) ×4 IMPLANT
GOWN STRL REUS W/ TWL LRG LVL3 (GOWN DISPOSABLE) ×1 IMPLANT
GOWN STRL REUS W/ TWL XL LVL3 (GOWN DISPOSABLE) ×1 IMPLANT
GOWN STRL REUS W/TWL LRG LVL3 (GOWN DISPOSABLE) ×1
GOWN STRL REUS W/TWL XL LVL3 (GOWN DISPOSABLE) ×1
NEEDLE HYPO 25X1 1.5 SAFETY (NEEDLE) ×2 IMPLANT
NS IRRIG 1000ML POUR BTL (IV SOLUTION) ×2 IMPLANT
PACK BASIN DAY SURGERY FS (CUSTOM PROCEDURE TRAY) ×2 IMPLANT
PENCIL BUTTON HOLSTER BLD 10FT (ELECTRODE) ×2 IMPLANT
SLEEVE SCD COMPRESS KNEE MED (MISCELLANEOUS) IMPLANT
SPONGE LAP 4X18 X RAY DECT (DISPOSABLE) ×2 IMPLANT
SUT MNCRL AB 4-0 PS2 18 (SUTURE) ×2 IMPLANT
SUT VIC AB 2-0 SH 27 (SUTURE)
SUT VIC AB 2-0 SH 27XBRD (SUTURE) IMPLANT
SUT VIC AB 3-0 SH 27 (SUTURE) ×1
SUT VIC AB 3-0 SH 27X BRD (SUTURE) ×1 IMPLANT
SYR BULB 3OZ (MISCELLANEOUS) IMPLANT
SYR CONTROL 10ML LL (SYRINGE) ×2 IMPLANT
TOWEL OR 17X24 6PK STRL BLUE (TOWEL DISPOSABLE) ×2 IMPLANT
TOWEL OR NON WOVEN STRL DISP B (DISPOSABLE) ×2 IMPLANT
TUBE CONNECTING 20X1/4 (TUBING) IMPLANT
YANKAUER SUCT BULB TIP NO VENT (SUCTIONS) IMPLANT

## 2017-03-17 NOTE — Anesthesia Preprocedure Evaluation (Addendum)
Anesthesia Evaluation  Patient identified by MRN, date of birth, ID band Patient awake    Reviewed: Allergy & Precautions, NPO status , Patient's Chart, lab work & pertinent test results  Airway Mallampati: I  TM Distance: >3 FB Neck ROM: Full    Dental no notable dental hx.    Pulmonary former smoker,    Pulmonary exam normal breath sounds clear to auscultation       Cardiovascular negative cardio ROS Normal cardiovascular exam Rhythm:Regular Rate:Normal  ECG: SR, rate 67   Neuro/Psych  Headaches, PSYCHIATRIC DISORDERS Depression TBI    GI/Hepatic negative GI ROS, Neg liver ROS,   Endo/Other  negative endocrine ROS  Renal/GU negative Renal ROS     Musculoskeletal negative musculoskeletal ROS (+)   Abdominal   Peds  Hematology negative hematology ROS (+)   Anesthesia Other Findings left buttock mass  Reproductive/Obstetrics                            Anesthesia Physical Anesthesia Plan  ASA: II  Anesthesia Plan: General   Post-op Pain Management:    Induction: Intravenous  PONV Risk Score and Plan: 3 and Midazolam, Dexamethasone, Ondansetron and Treatment may vary due to age or medical condition  Airway Management Planned: Oral ETT  Additional Equipment:   Intra-op Plan:   Post-operative Plan: Extubation in OR  Informed Consent: I have reviewed the patients History and Physical, chart, labs and discussed the procedure including the risks, benefits and alternatives for the proposed anesthesia with the patient or authorized representative who has indicated his/her understanding and acceptance.   Dental advisory given  Plan Discussed with: CRNA  Anesthesia Plan Comments:         Anesthesia Quick Evaluation

## 2017-03-17 NOTE — Anesthesia Procedure Notes (Signed)
Procedure Name: Intubation Date/Time: 03/17/2017 10:00 AM Performed by: Signe Colt, CRNA Pre-anesthesia Checklist: Patient identified, Emergency Drugs available, Suction available and Patient being monitored Patient Re-evaluated:Patient Re-evaluated prior to induction Oxygen Delivery Method: Circle system utilized Preoxygenation: Pre-oxygenation with 100% oxygen Induction Type: IV induction Ventilation: Mask ventilation without difficulty Laryngoscope Size: Mac and 3 Grade View: Grade I Tube type: Oral Tube size: 7.0 mm Number of attempts: 1 Airway Equipment and Method: Stylet and Oral airway Placement Confirmation: ETT inserted through vocal cords under direct vision,  positive ETCO2 and breath sounds checked- equal and bilateral Secured at: 21 cm Tube secured with: Tape Dental Injury: Teeth and Oropharynx as per pre-operative assessment

## 2017-03-17 NOTE — Interval H&P Note (Signed)
History and Physical Interval Note: no change in H and P  03/17/2017 9:49 AM  Tamara Williamson  has presented today for surgery, with the diagnosis of left buttock mass  The various methods of treatment have been discussed with the patient and family. After consideration of risks, benefits and other options for treatment, the patient has consented to  Procedure(s): EXCISION MASS LEFT BUTTOCK (N/A) as a surgical intervention .  The patient's history has been reviewed, patient examined, no change in status, stable for surgery.  I have reviewed the patient's chart and labs.  Questions were answered to the patient's satisfaction.     [name] A

## 2017-03-17 NOTE — Discharge Instructions (Signed)
No Ibuprofen products until after 4pm today.   Post Anesthesia Home Care Instructions  Activity: Get plenty of rest for the remainder of the day. A responsible individual must stay with you for 24 hours following the procedure.  For the next 24 hours, DO NOT: -Drive a car -Paediatric nurse -Drink alcoholic beverages -Take any medication unless instructed by your physician -Make any legal decisions or sign important papers.  Meals: Start with liquid foods such as gelatin or soup. Progress to regular foods as tolerated. Avoid greasy, spicy, heavy foods. If nausea and/or vomiting occur, drink only clear liquids until the nausea and/or vomiting subsides. Call your physician if vomiting continues.  Special Instructions/Symptoms: Your throat may feel dry or sore from the anesthesia or the breathing tube placed in your throat during surgery. If this causes discomfort, gargle with warm salt water. The discomfort should disappear within 24 hours.  If you had a scopolamine patch placed behind your ear for the management of post- operative nausea and/or vomiting:  1. The medication in the patch is effective for 72 hours, after which it should be removed.  Wrap patch in a tissue and discard in the trash. Wash hands thoroughly with soap and water. 2. You may remove the patch earlier than 72 hours if you experience unpleasant side effects which may include dry mouth, dizziness or visual disturbances. 3. Avoid touching the patch. Wash your hands with soap and water after contact with the patch.       No vigorous activity, running for one week  Ice pack, Tylenol, Ibuprofen, Aleve for pain also  Ok to shower starting tomorrow.  No soaking in a tub for one week.

## 2017-03-17 NOTE — Op Note (Signed)
EXCISION MASS LEFT BUTTOCK  Procedure Note  Milina Pagett 03/17/2017   Pre-op Diagnosis: left buttock mass     Post-op Diagnosis: same  Procedure(s): EXCISION MASS LEFT BUTTOCK (1.5 cm)  Surgeon(s): Coralie Keens, MD  Anesthesia: General  Staff:  Circulator: Eda Paschal, RN Scrub Person: Humberto Seals, RN  Estimated Blood Loss: Minimal               Specimens: sent to path  Procedure: The patient was brought to the operating room identifies correct patient.  General anesthesia was induced.  She was then placed in the prone position on the operating table.  Her left buttock was then prepped and draped in usual sterile fashion.  There is a 1.5 cm lesion which was soft involving the skin of the left buttock.  I anesthetized skin with Marcaine in the mid elliptical incision with a scalpel.  I then excised the mass and then started with electrocautery.  There did not appear to be a deep component to the mass.  At this point I achieved hemostasis with cautery.  I anesthetized the wound further with Marcaine.  I then closed the subtenons tissue with interrupted 3-0 Vicryl sutures and closed the skin with a running 4-0 Monocryl.  Dermabond was then applied.  The patient tolerated procedure well.  All the counts were correct at the end of procedure.  The patient was then extubated in the operating room and taken in a stable condition to the recovery room.          Liela Rylee A   Date: 03/17/2017  Time: 10:35 AM

## 2017-03-17 NOTE — Anesthesia Postprocedure Evaluation (Signed)
Anesthesia Post Note  Patient: Maura Braaten  Procedure(s) Performed: EXCISION MASS LEFT BUTTOCK (N/A Buttocks)     Patient location during evaluation: PACU Anesthesia Type: General Level of consciousness: awake and alert Pain management: pain level controlled Vital Signs Assessment: post-procedure vital signs reviewed and stable Respiratory status: spontaneous breathing, nonlabored ventilation, respiratory function stable and patient connected to nasal cannula oxygen Cardiovascular status: blood pressure returned to baseline and stable Postop Assessment: no apparent nausea or vomiting Anesthetic complications: no    Last Vitals:  Vitals:   03/17/17 1035 03/17/17 1233  BP: 112/74 118/70  Pulse: 77 63  Resp: 17 16  Temp: (!) 36.4 C 36.6 C  SpO2: 100% 99%    Last Pain:  Vitals:   03/17/17 1233  TempSrc: Oral  PainSc:                  Karyl Kinnier Caleigha Zale

## 2017-03-17 NOTE — Transfer of Care (Signed)
Immediate Anesthesia Transfer of Care Note  Patient: Tamara Williamson  Procedure(s) Performed: EXCISION MASS LEFT BUTTOCK (N/A Buttocks)  Patient Location: PACU  Anesthesia Type:General  Level of Consciousness: awake and patient cooperative  Airway & Oxygen Therapy: Patient Spontanous Breathing and Patient connected to face mask oxygen  Post-op Assessment: Report given to RN and Post -op Vital signs reviewed and stable  Post vital signs: Reviewed and stable  Last Vitals:  Vitals:   03/17/17 0909  BP: 112/73  Pulse: (!) 58  Temp: 36.9 C  SpO2: 100%    Last Pain:  Vitals:   03/17/17 0909  TempSrc: Oral  PainSc: 0-No pain         Complications: No apparent anesthesia complications

## 2017-03-18 ENCOUNTER — Encounter (HOSPITAL_BASED_OUTPATIENT_CLINIC_OR_DEPARTMENT_OTHER): Payer: Self-pay | Admitting: Surgery

## 2017-03-30 DIAGNOSIS — F4320 Adjustment disorder, unspecified: Secondary | ICD-10-CM | POA: Diagnosis not present

## 2017-04-13 DIAGNOSIS — F4320 Adjustment disorder, unspecified: Secondary | ICD-10-CM | POA: Diagnosis not present

## 2017-04-27 DIAGNOSIS — F4320 Adjustment disorder, unspecified: Secondary | ICD-10-CM | POA: Diagnosis not present

## 2017-06-08 DIAGNOSIS — F4320 Adjustment disorder, unspecified: Secondary | ICD-10-CM | POA: Diagnosis not present

## 2017-06-15 DIAGNOSIS — F4320 Adjustment disorder, unspecified: Secondary | ICD-10-CM | POA: Diagnosis not present

## 2017-07-05 DIAGNOSIS — F4320 Adjustment disorder, unspecified: Secondary | ICD-10-CM | POA: Diagnosis not present

## 2017-08-04 DIAGNOSIS — Z30014 Encounter for initial prescription of intrauterine contraceptive device: Secondary | ICD-10-CM | POA: Diagnosis not present

## 2017-08-04 DIAGNOSIS — N6320 Unspecified lump in the left breast, unspecified quadrant: Secondary | ICD-10-CM | POA: Diagnosis not present

## 2017-08-05 ENCOUNTER — Other Ambulatory Visit: Payer: Self-pay | Admitting: Family Medicine

## 2017-08-05 DIAGNOSIS — N6320 Unspecified lump in the left breast, unspecified quadrant: Secondary | ICD-10-CM

## 2017-08-13 ENCOUNTER — Ambulatory Visit
Admission: RE | Admit: 2017-08-13 | Discharge: 2017-08-13 | Disposition: A | Discharge status: Home / Self Care | Payer: BLUE CROSS/BLUE SHIELD | Source: Ambulatory Visit | Attending: Family Medicine | Admitting: Family Medicine

## 2017-08-13 DIAGNOSIS — N6320 Unspecified lump in the left breast, unspecified quadrant: Secondary | ICD-10-CM

## 2017-08-13 DIAGNOSIS — N6323 Unspecified lump in the left breast, lower outer quadrant: Secondary | ICD-10-CM | POA: Diagnosis not present

## 2017-08-13 DIAGNOSIS — R922 Inconclusive mammogram: Secondary | ICD-10-CM | POA: Diagnosis not present

## 2017-08-24 DIAGNOSIS — Z30430 Encounter for insertion of intrauterine contraceptive device: Secondary | ICD-10-CM | POA: Diagnosis not present

## 2017-08-25 DIAGNOSIS — F4320 Adjustment disorder, unspecified: Secondary | ICD-10-CM | POA: Diagnosis not present

## 2017-09-07 DIAGNOSIS — F4320 Adjustment disorder, unspecified: Secondary | ICD-10-CM | POA: Diagnosis not present

## 2017-09-14 ENCOUNTER — Telehealth: Payer: Self-pay

## 2017-09-14 NOTE — Telephone Encounter (Signed)
Left message for patient to call back to schedule appointment.

## 2017-09-14 NOTE — Telephone Encounter (Signed)
Copied from [place]. Topic: Appointment Scheduling - Scheduling Inquiry for Clinic >> Sep 14, 2017 12:05 PM Tamara Williamson wrote: Reason for CRM: Patient is wanting to establish with Dr Tamala Julian for left hip pain.. Patient has a marathon on 9/15 and wants to be seen before then so she can run in the marathon. Next available for a new patient is 9/17. Thanks!  Call back @ [phone]

## 2017-09-16 DIAGNOSIS — M25552 Pain in left hip: Secondary | ICD-10-CM | POA: Diagnosis not present

## 2017-09-23 ENCOUNTER — Other Ambulatory Visit (HOSPITAL_COMMUNITY)
Admission: RE | Admit: 2017-09-23 | Discharge: 2017-09-23 | Disposition: A | Discharge status: Home / Self Care | Payer: BLUE CROSS/BLUE SHIELD | Source: Ambulatory Visit | Attending: Family Medicine | Admitting: Family Medicine

## 2017-09-23 ENCOUNTER — Other Ambulatory Visit: Payer: Self-pay | Admitting: Family Medicine

## 2017-09-23 DIAGNOSIS — Z124 Encounter for screening for malignant neoplasm of cervix: Secondary | ICD-10-CM | POA: Insufficient documentation

## 2017-09-23 DIAGNOSIS — Z0001 Encounter for general adult medical examination with abnormal findings: Secondary | ICD-10-CM | POA: Diagnosis not present

## 2017-09-23 DIAGNOSIS — M25552 Pain in left hip: Secondary | ICD-10-CM | POA: Diagnosis not present

## 2017-09-23 DIAGNOSIS — Z23 Encounter for immunization: Secondary | ICD-10-CM | POA: Diagnosis not present

## 2017-09-23 DIAGNOSIS — Z13220 Encounter for screening for lipoid disorders: Secondary | ICD-10-CM | POA: Diagnosis not present

## 2017-09-23 DIAGNOSIS — F419 Anxiety disorder, unspecified: Secondary | ICD-10-CM | POA: Diagnosis not present

## 2017-09-23 DIAGNOSIS — Z131 Encounter for screening for diabetes mellitus: Secondary | ICD-10-CM | POA: Diagnosis not present

## 2017-09-24 ENCOUNTER — Encounter: Payer: Self-pay | Admitting: Sports Medicine

## 2017-09-24 ENCOUNTER — Ambulatory Visit: Payer: BLUE CROSS/BLUE SHIELD | Admitting: Sports Medicine

## 2017-09-24 DIAGNOSIS — M25552 Pain in left hip: Secondary | ICD-10-CM | POA: Diagnosis not present

## 2017-09-24 MED ORDER — MELOXICAM 15 MG PO TABS: 15.0000 mg | ORAL_TABLET | Freq: Every day | ORAL | 0 refills | Status: AC

## 2017-09-24 NOTE — Assessment & Plan Note (Signed)
Reports left lateral hip pain x 1.5 months.  Exam notable for some weakness with leg abduction in left compared to her right side and positive trendelenberg sign, but otherwise within normal.    Unlikely arthritic changes within hip given pain does not radiate to the groin.  Would suspect some component of gluteus medius weakness given exam and point tenderness, have recommended home exercises.  Will trial Mobic as she has not had much improvement with Aleve.   -Rx: Mobic 15 mg tabs, advise once a day with meals x7 days, followed by daily prn  -home exercises for strengthening and rehab -follow up in 8 weeks

## 2017-09-24 NOTE — Patient Instructions (Addendum)
You were seen today for lateral hip pain.  As we discussed, you may find relief with strengthening the gluteus medius with home exercises.   Additionally, I am prescribing you an antiinflammatory medication called Mobic.  Take this daily with food for 7 days, then daily as needed for hip pain.   Avoid taking it on race day itself.  You can follow up in 8 weeks.   Lovenia Kim MD

## 2017-09-24 NOTE — Progress Notes (Addendum)
   Subjective:   Patient ID: Tamara Williamson    DOB: [birth year], 52 y.o. female   MRN: [id number]  CC: left hip pain   HPI: Tamara Williamson is a 52 y.o. female who presents to clinic today for left hip pain which has been bothersome on and off for the past 1.5 months.  Pain is localized to the lateral aspect of hip.  When sitting at rest, 0/10 and does not bother her.  Exacerbated with running, 7/10 at it's worst, does not radiate into the leg. She describes the pain as dull and achy.  She has been training for a half marathon coming up in 8 days from now.  She has trained gradually over the last 4.5 months for this.  No h/o falls of trauma.  Pain is worst while running and for up to 24h afterwards.  Better with rest and sitting.  Pain does not radiate to the groin.  She has run once in the last month due to this.  Initially tried Aleve which did not provide much relief in symptoms.  Her PCP recently prescribed her a prednisone taper which she completed.  Felt great while taking prednisone however pain returned shortly afterwards,  No numbness, tingling reported.  She states she has a leg length discrepancy.  She warms up adequately prior to running, stretches afterwards.  Has also tried ice and IT band stretches.    ROS: No swelling, weakness, numbness, tingling.   [place]: Pertinent past medical, surgical, family, and social history were reviewed and updated as appropriate. Smoking status reviewed.  Medications reviewed. Objective:   BP 122/74   Ht 5\' 8"  (1.727 m)   Wt 150 lb (68 kg)   BMI 22.81 kg/m  Vitals and nursing note reviewed.  General: 52 yo female, NAD   Left hip:  ROM- IR: 80 Deg, ER: 80 Deg, Flexion: 120 Deg, Extension: 100 Deg, Abduction: 45 Deg, Adduction: 45 Deg Strength -IR: 5/5, ER: 5/5, Flexion: 5/5, Extension: 5/5, Abduction: 4/5, Adduction: 5/5 Positive Trendelenburg.    Point tenderness noted over gluteus medius.  No tenderness over piriformis and greater  trochanter. No SI joint tenderness and normal minimal SI movement. Skin: warm, dry, no rash Extremities: warm and well perfused, normal tone  Neuro: alert, oriented x3, no focal deficits   Assessment & Plan:   Left hip pain Reports left lateral hip pain x 1.5 months.  Exam notable for some weakness with leg abduction in left compared to her right side and positive trendelenberg sign, but otherwise within normal.    Unlikely arthritic changes within hip given pain does not radiate to the groin.  Would suspect some component of gluteus medius weakness given exam and point tenderness, have recommended home exercises.  Will trial Mobic as she has not had much improvement with Aleve.   -Rx: Mobic 15 mg tabs, advise once a day with meals x7 days, followed by daily prn  -home exercises for strengthening and rehab -follow up in 8 weeks  Meds ordered this encounter  Medications  . meloxicam (MOBIC) 15 MG tablet    Sig: Take 1 tablet (15 mg total) by mouth daily. Take with food for 7 days, then as needed.    Dispense:  40 tablet    Refill:  0   Lovenia Kim, MD [facility] PGY-3 09/24/2017 4:34 PM   Patient seen and evaluated with the resident. I agree with the above plan of care. Follow-up in 8 weeks for reevaluation.

## 2017-09-27 LAB — CYTOLOGY - PAP: Diagnosis: NEGATIVE

## 2017-10-19 DIAGNOSIS — F4320 Adjustment disorder, unspecified: Secondary | ICD-10-CM | POA: Diagnosis not present

## 2017-10-26 DIAGNOSIS — F4320 Adjustment disorder, unspecified: Secondary | ICD-10-CM | POA: Diagnosis not present

## 2017-11-30 DIAGNOSIS — F4320 Adjustment disorder, unspecified: Secondary | ICD-10-CM | POA: Diagnosis not present

## 2017-12-06 DIAGNOSIS — D126 Benign neoplasm of colon, unspecified: Secondary | ICD-10-CM | POA: Diagnosis not present

## 2017-12-06 DIAGNOSIS — Z1211 Encounter for screening for malignant neoplasm of colon: Secondary | ICD-10-CM | POA: Diagnosis not present

## 2017-12-07 DIAGNOSIS — F4320 Adjustment disorder, unspecified: Secondary | ICD-10-CM | POA: Diagnosis not present

## 2017-12-08 DIAGNOSIS — D126 Benign neoplasm of colon, unspecified: Secondary | ICD-10-CM | POA: Diagnosis not present

## 2018-03-17 DIAGNOSIS — N841 Polyp of cervix uteri: Secondary | ICD-10-CM | POA: Diagnosis not present

## 2018-03-23 ENCOUNTER — Other Ambulatory Visit: Payer: Self-pay | Admitting: Obstetrics and Gynecology

## 2018-03-23 ENCOUNTER — Other Ambulatory Visit (HOSPITAL_COMMUNITY)
Admission: RE | Admit: 2018-03-23 | Discharge: 2018-03-23 | Disposition: A | Discharge status: Home / Self Care | Payer: Commercial Managed Care - PPO | Source: Ambulatory Visit | Attending: Obstetrics and Gynecology | Admitting: Obstetrics and Gynecology

## 2018-03-23 DIAGNOSIS — Z124 Encounter for screening for malignant neoplasm of cervix: Secondary | ICD-10-CM | POA: Insufficient documentation

## 2018-03-23 DIAGNOSIS — N841 Polyp of cervix uteri: Secondary | ICD-10-CM | POA: Diagnosis not present

## 2018-04-01 LAB — CYTOLOGY - PAP
Diagnosis: NEGATIVE
HPV: NOT DETECTED

## 2018-04-28 DIAGNOSIS — L237 Allergic contact dermatitis due to plants, except food: Secondary | ICD-10-CM | POA: Diagnosis not present

## 2018-07-19 IMAGING — MR MR MRA HEAD W/O CM
9 of 11 series · 30 of 48 positions shown · non-contrast
Comparison: Head CT 01/27/2017

CLINICAL DATA: Blurry vision

EXAM:
MRI HEAD WITHOUT CONTRAST
MRA HEAD WITHOUT CONTRAST
TECHNIQUE: Multiplanar, multiecho pulse sequences of the brain and surrounding
structures were obtained without intravenous contrast. Angiographic
images of the head were obtained using MRA technique without
contrast.

[Series 3: DWI · axial · 3.0mm · 0.94mm/px · z∈[-107,+40]mm · 7 of 102 slices shown (1 of 2)]
[im 1/102]
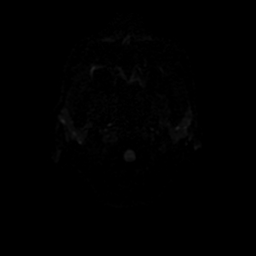
[im 17/102]
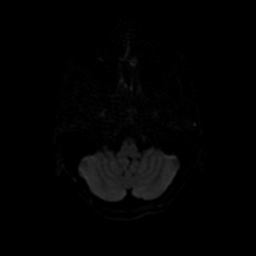
[im 34/102]
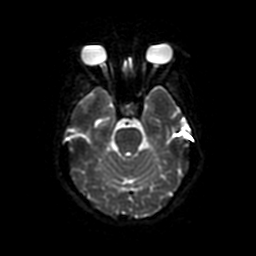
[im 51/102]
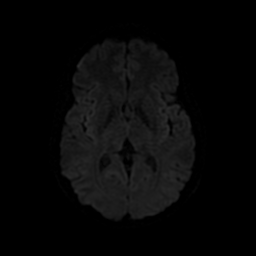
[im 68/102]
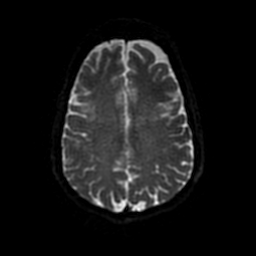
[im 85/102]
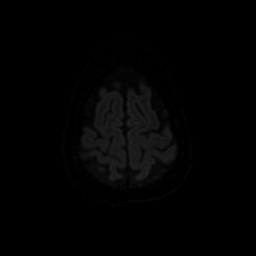
[im 102/102]
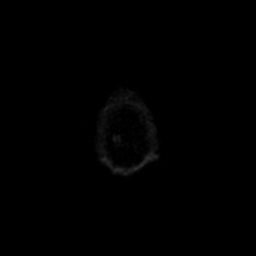

[Series 4: ax (id) 2 · axial · 1.0mm · 0.43mm/px · z∈[-73,-20]mm · 5 of 184 slices shown]
[im 1/184]
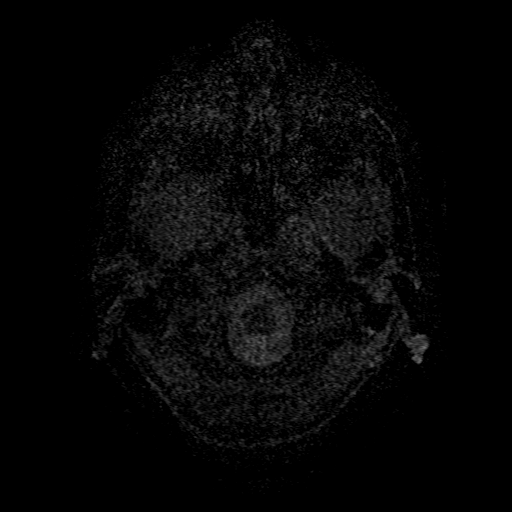
[im 31/184]
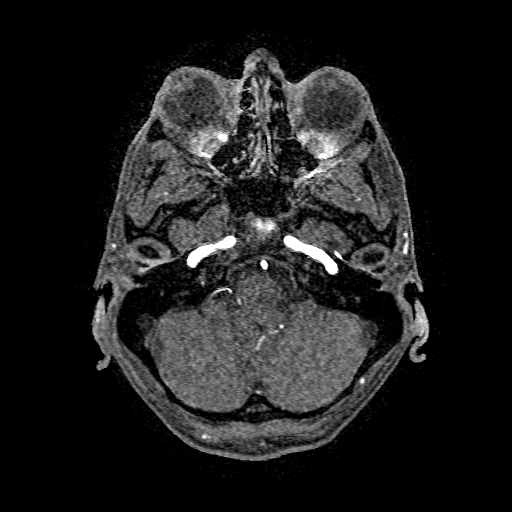
[im 62/184]
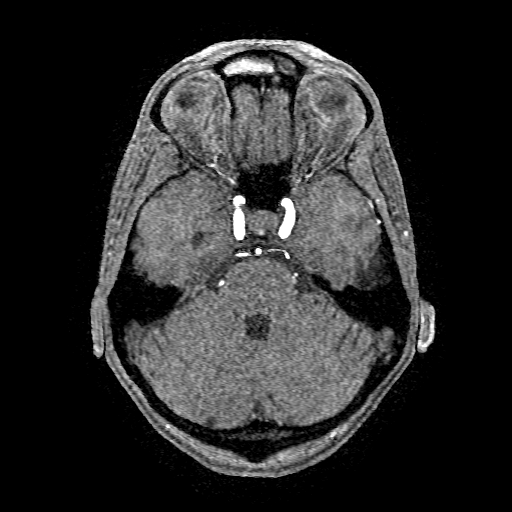
[im 77/184]
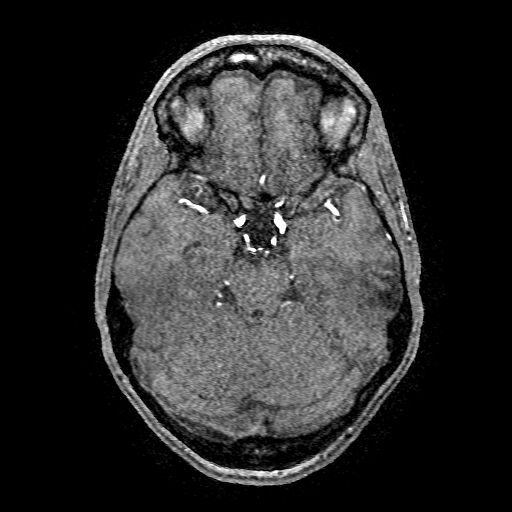
[im 107/184]
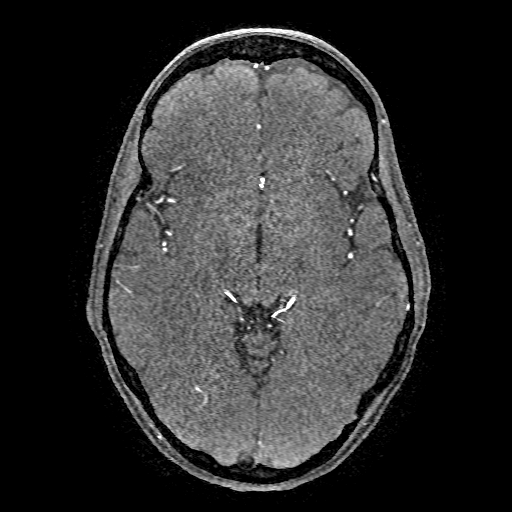

[Series 5: FLAIR · sagittal · 5.0mm · 0.47mm/px · 2 of 23 slices shown (1 of 2)]
[im 1/23]
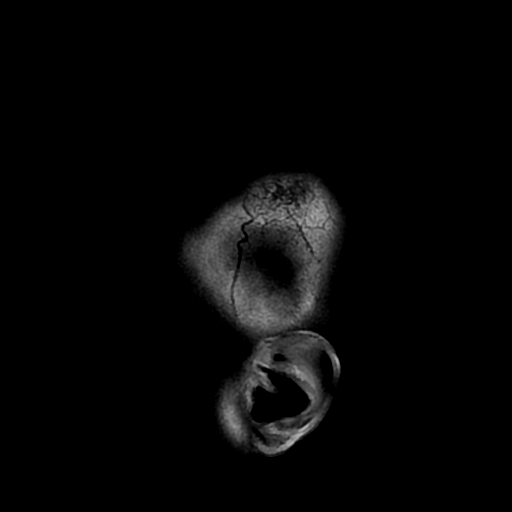
[im 23/23]
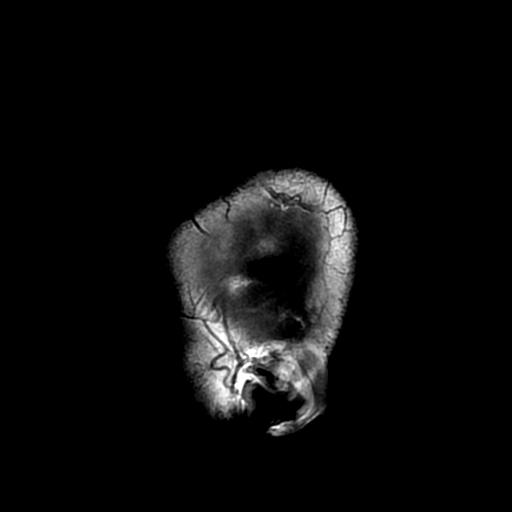

[Series 6: T2 · axial · 5.0mm · 0.43mm/px · z∈[-104,+42]mm · 2 of 26 slices shown (1 of 2)]
[im 1/26]
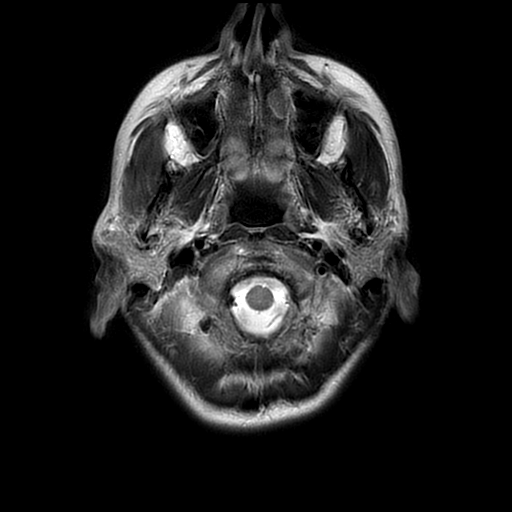
[im 26/26]
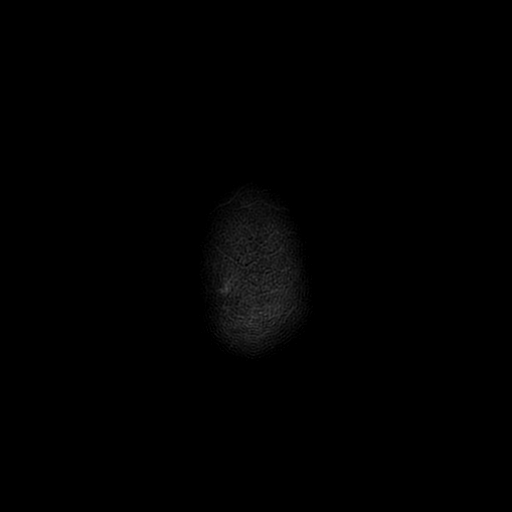

[Series 7: FLAIR · axial · 3.0mm · 0.43mm/px · z∈[-104,+42]mm · 2 of 26 slices shown (2 of 2)]
[im 1/26]
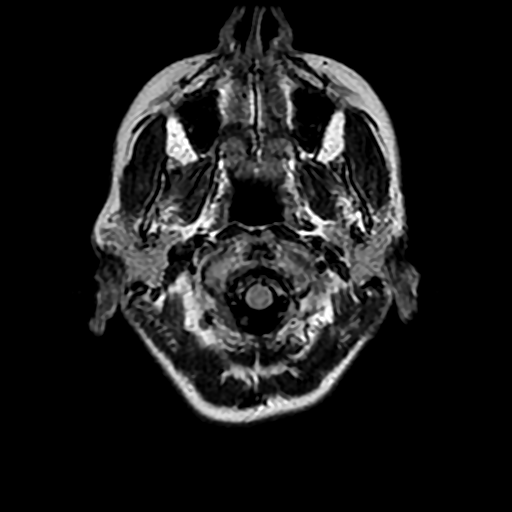
[im 26/26]
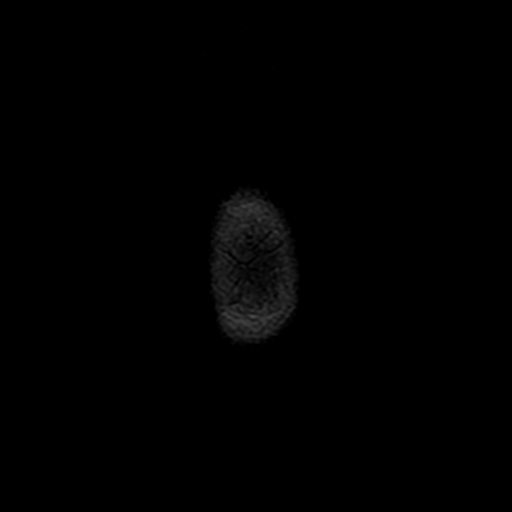

[Series 10: T2 · coronal · 5.0mm · 0.39mm/px · 2 of 30 slices shown (2 of 2)]
[im 1/30]
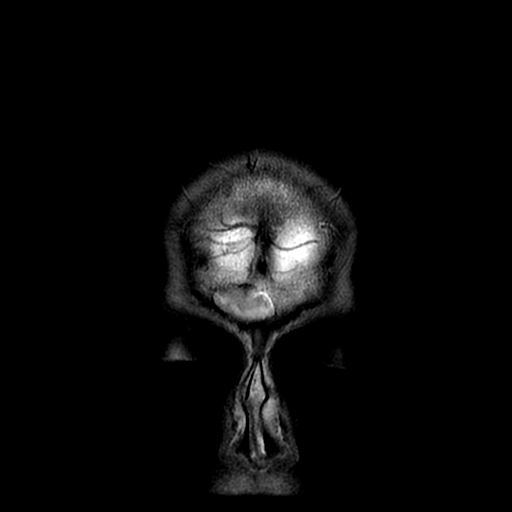
[im 30/30]
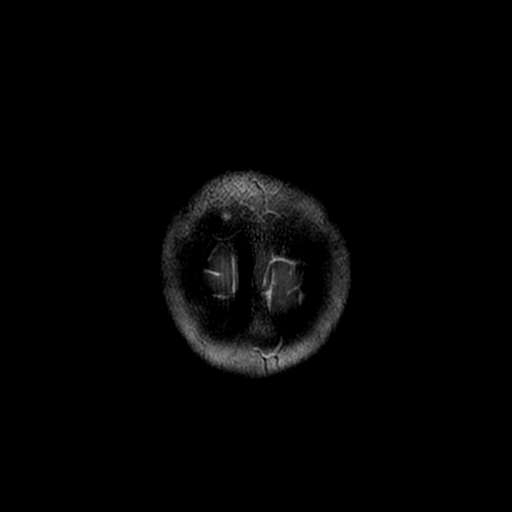

[Series 11: DWI · coronal · 4.0mm · 0.94mm/px · 5 of 72 slices shown (2 of 2)]
[im 1/72]
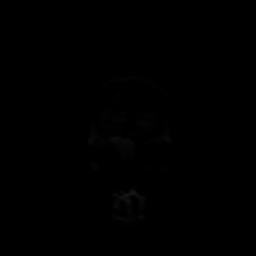
[im 18/72]
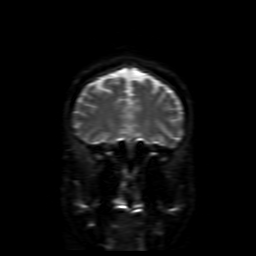
[im 36/72]
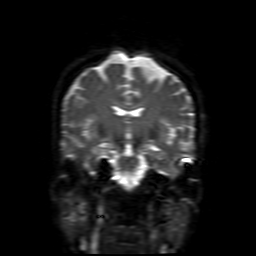
[im 54/72]
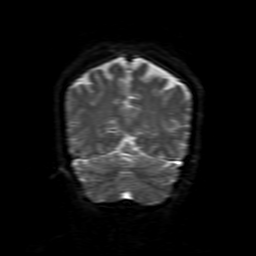
[im 72/72]
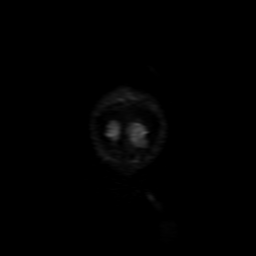

[Series 350: ADC · axial · 3.0mm · 0.94mm/px · z∈[-107,+40]mm · 3 of 51 slices shown (1 of 2)]
[im 1/51]
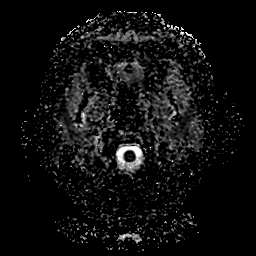
[im 26/51]
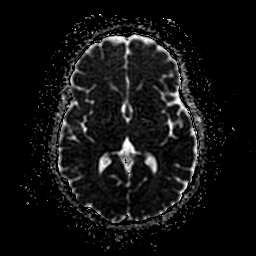
[im 51/51]
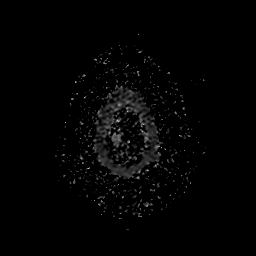

[Series 1150: ADC · coronal · 4.0mm · 0.94mm/px · 2 of 36 slices shown (2 of 2)]
[im 1/36]
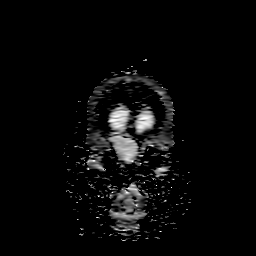
[im 36/36]
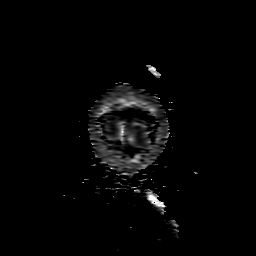

[30 of 48 positions shown; findings below may reference images not displayed]

FINDINGS: MRI HEAD FINDINGS

Brain: The midline structures are normal. No focal diffusion
restriction to indicate acute infarct. No intraparenchymal
hemorrhage. Area of encephalomalacia in the left temporal lobe is
unchanged. The parenchymal signal of the brain is otherwise normal.
No mass lesion. No chronic microhemorrhage or cerebral amyloid
angiopathy. No hydrocephalus, age advanced atrophy or lobar
predominant volume loss. No dural abnormality or extra-axial
collection.

Skull and upper cervical spine: The visualized skull base,
calvarium, upper cervical spine and extracranial soft tissues are
normal.

Sinuses/Orbits: No fluid levels or advanced mucosal thickening. No
mastoid effusion. Normal orbits.

MRA HEAD FINDINGS

Intracranial internal carotid arteries: Normal.

Anterior cerebral arteries: Normal.

Middle cerebral arteries: Normal.

Posterior communicating arteries: Present bilaterally.

Posterior cerebral arteries: Normal.

Basilar artery: Normal.

Vertebral arteries: Left dominant. Normal.

Superior cerebellar arteries: Normal.

Anterior inferior cerebellar arteries: Normal on the right. Not
clearly seen on the left.

Posterior inferior cerebellar arteries: Normal.
IMPRESSION: 1. No acute intracranial abnormality.
2. No emergent large vessel occlusion or high-grade intracranial
stenosis.
3. Left temporal lobe encephalomalacia at the site of remote trauma.

## 2018-07-19 IMAGING — CT CT ANGIO HEAD
1 of 12 series · 4 of 33 positions shown · IV contrast (APPLIED)
Comparison: MRI 01/28/2017, CT 02/24/2010

CLINICAL DATA: Stroke

EXAM:
CT ANGIOGRAPHY HEAD AND NECK
TECHNIQUE: Multidetector CT imaging of the head and neck was performed using
the standard protocol during bolus administration of intravenous
contrast. Multiplanar CT image reconstructions and MIPs were
obtained to evaluate the vascular anatomy. Carotid stenosis
measurements (when applicable) are obtained utilizing NASCET
criteria, using the distal internal carotid diameter as the
denominator.
CONTRAST:  <See Chart> UV26P7-4RY IOPAMIDOL (UV26P7-4RY) INJECTION
76%

[Series 11: ax thins · axial · 0.28mm/px · z∈[-235,-36]mm · 4 of 333 slices shown]
[im 67/333  soft-tissue]
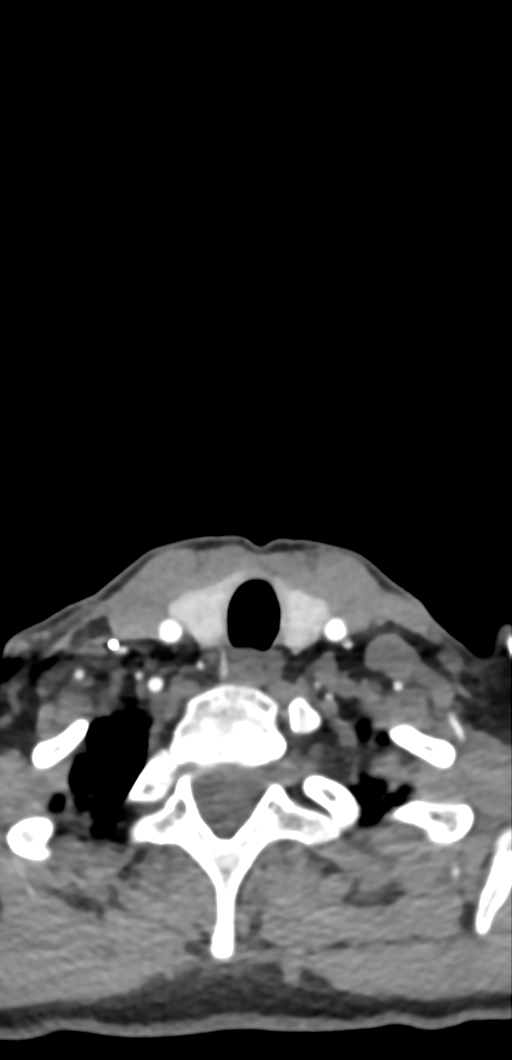
[im 133/333  bone]
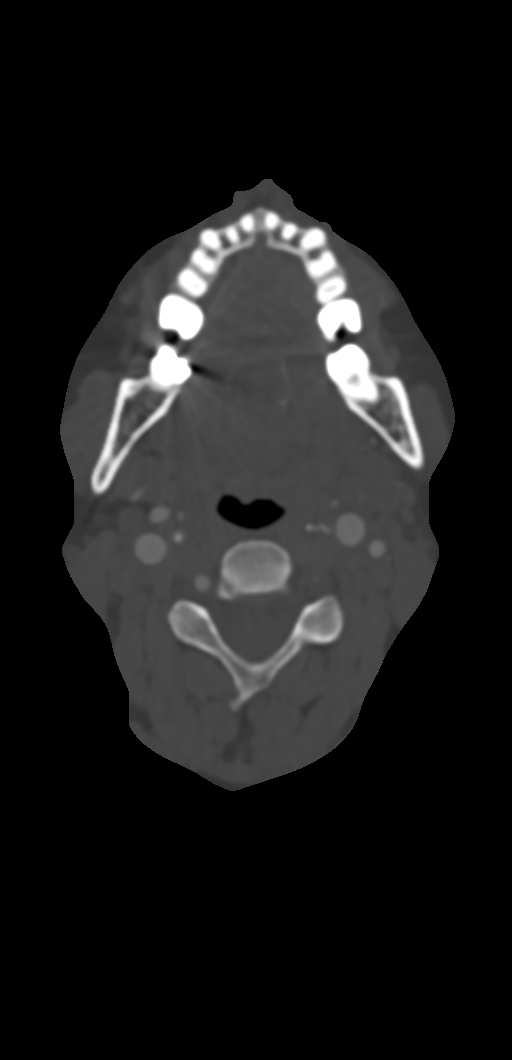
[im 200/333  soft-tissue]
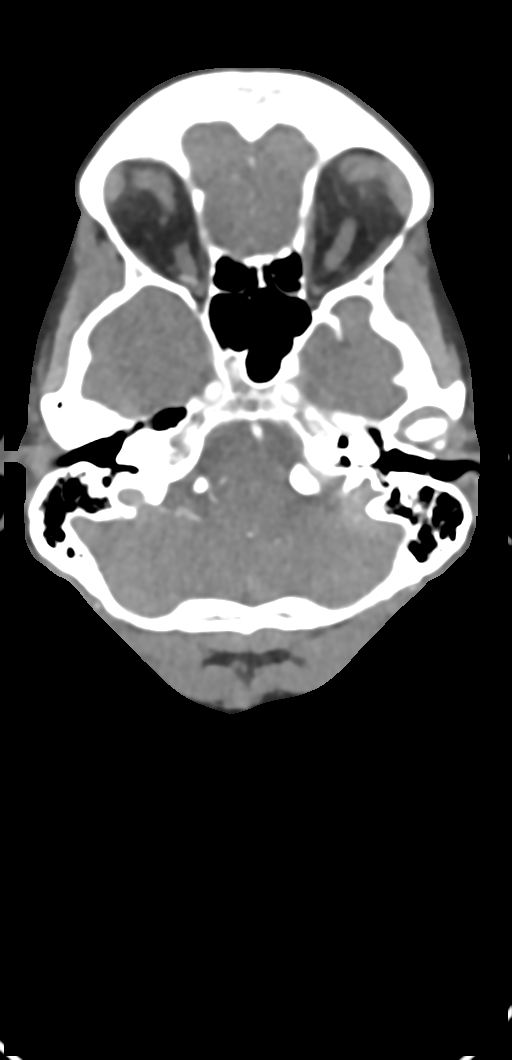
[im 266/333  bone]
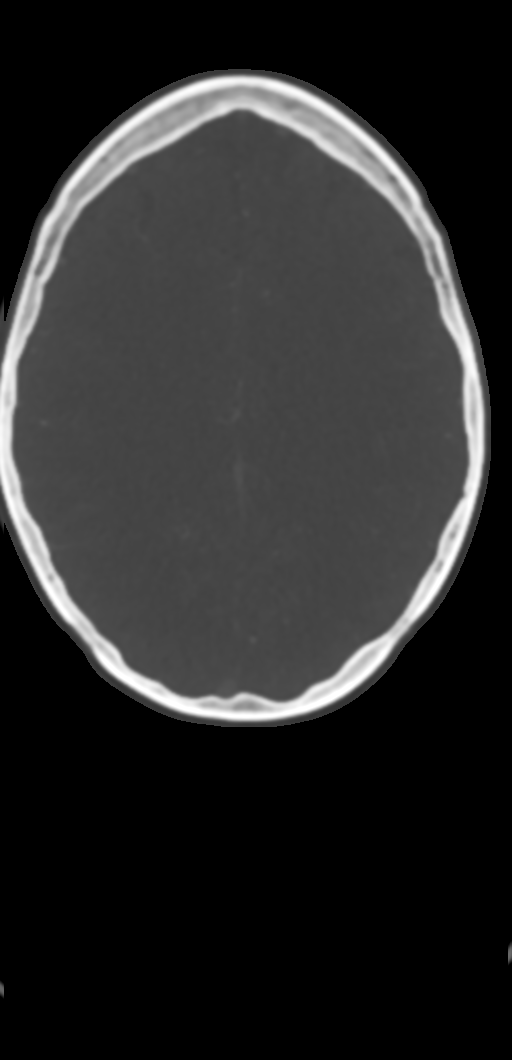

[4 of 33 positions shown; findings below may reference images not displayed]

FINDINGS: CT HEAD FINDINGS

Brain: Chronic encephalomalacia left posterolateral temporal lobe
compatible with prior traumatic brain injury. No acute infarct or
hemorrhage or mass. Ventricle size is normal.

Vascular: Negative for hyperdense vessel

Skull: Chronic left temporal fracture unchanged from prior studies

Sinuses: Minimal mucosal edema paranasal sinuses.

Orbits: Normal

Review of the MIP images confirms the above findings

CTA NECK FINDINGS

Aortic arch: Normal aortic arch and proximal great vessels. Surgical
clips right supraclavicular region, presumably from repair of right
subclavian artery injury seen on the prior CTA.

Right carotid system: Normal right carotid system without stenosis
or dissection

Left carotid system: Normal left carotid system without stenosis or
dissection

Vertebral arteries: Right vertebral dominant and widely patent to
the basilar. Left vertebral artery is very small and occludes
distally at the C1 level. This is chronic and unchanged from CT
09/14/2010.

Skeleton: Mild disc degeneration and spurring at C5-6.

Other neck: Chronic fracture right clavicle with plate fixation.

Upper chest: Lung apices clear.

Review of the MIP images confirms the above findings

CTA HEAD FINDINGS

Anterior circulation: Cavernous carotid widely patent bilaterally.
Anterior and middle cerebral arteries widely patent bilaterally
without stenosis.

Posterior circulation: Right vertebral artery supplies the basilar.
There is probable retrograde flow in the distal left vertebral
artery supplying the left PICA. There is occlusion of the left
vertebral artery at C1. Basilar widely patent. Superior cerebellar
and posterior cerebral arteries patent bilaterally. Fetal origin
posterior cerebral artery bilaterally.

Venous sinuses: Negative

Anatomic variants: None

Delayed phase: Normal enhancement on delayed imaging

Review of the MIP images confirms the above findings
IMPRESSION: Chronic posttraumatic encephalomalacia left lateral temporal lobe
unchanged from prior studies

Small, non dominant left vertebral artery occludes at the C1 level,
unchanged from CT 0880. Right vertebral artery widely patent. Both
carotid arteries widely patent.

No significant intracranial stenosis. Negative for emergent large
vessel occlusion.

## 2019-12-22 ENCOUNTER — Other Ambulatory Visit: Payer: Self-pay

## 2019-12-22 ENCOUNTER — Encounter (HOSPITAL_BASED_OUTPATIENT_CLINIC_OR_DEPARTMENT_OTHER): Payer: Self-pay

## 2019-12-22 ENCOUNTER — Emergency Department (HOSPITAL_BASED_OUTPATIENT_CLINIC_OR_DEPARTMENT_OTHER)
Admission: EM | Admit: 2019-12-22 | Discharge: 2019-12-22 | Disposition: A | Discharge status: Home / Self Care | Attending: Emergency Medicine | Admitting: Emergency Medicine

## 2019-12-22 DIAGNOSIS — S0990XA Unspecified injury of head, initial encounter: Secondary | ICD-10-CM | POA: Diagnosis present

## 2019-12-22 DIAGNOSIS — W010XXA Fall on same level from slipping, tripping and stumbling without subsequent striking against object, initial encounter: Secondary | ICD-10-CM | POA: Diagnosis not present

## 2019-12-22 DIAGNOSIS — Z87891 Personal history of nicotine dependence: Secondary | ICD-10-CM | POA: Diagnosis not present

## 2019-12-22 DIAGNOSIS — Y9289 Other specified places as the place of occurrence of the external cause: Secondary | ICD-10-CM | POA: Diagnosis not present

## 2019-12-22 DIAGNOSIS — S0101XA Laceration without foreign body of scalp, initial encounter: Secondary | ICD-10-CM | POA: Insufficient documentation

## 2019-12-22 NOTE — Discharge Instructions (Addendum)
Keep the wound dry for 24 hours.  And you can wash your hair.  Staple removal in 7 days.  Return for any new or worse symptoms.

## 2019-12-22 NOTE — ED Provider Notes (Signed)
[facility] Provider Note   CSN: [id number] Arrival date & time: 12/22/19  0749     History Chief Complaint  Patient presents with  . Head Laceration    Tamara Williamson is a 54 y.o. female.  Patient tripped and fell at work.  She hit the back of her head on a 2 x 4 that was holding up a divider.  No loss of consciousness.  Did have bleeding.  Denies any other injuries.  States tetanus is up-to-date in the last 10 years.  No neck pain no extremity pain.  No nausea no vomiting no dizziness no lightheadedness.  Not on blood thinners.        Past Medical History:  Diagnosis Date  . Dental crowns present   . Depression   . Family history of adverse reaction to anesthesia    pt's daughter has hx. of being combative after anesthesia  . Heart murmur    no known problems, per pt.; no cardiologist  . History of kidney stones   . 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 Lipoma of buttock 02/2017   left buttock mass  . Migraines     Patient Active Problem List   Diagnosis Date Noted  . Left hip pain 09/24/2017  . Migraine aura without headache 02/28/2017  . Major depressive disorder   . FOOT PAIN, RIGHT 08/17/2006    Past Surgical History:  Procedure Laterality Date  . LACERATION REPAIR Right 02/24/2010   subclavian artery  . MASS EXCISION N/A 03/17/2017   Procedure: EXCISION MASS LEFT BUTTOCK;  Surgeon: Coralie Keens, MD;  Location: [facility];  Service: General;  Laterality: N/A;  . ORIF CLAVICLE FRACTURE Right 02/24/2010  . WISDOM TOOTH EXTRACTION  1989     OB History   No obstetric history on file.     Family History  Problem Relation Age of Onset  . Hypertension Mother   . Hyperlipidemia Mother   . Kidney disease Mother   . Heart disease Mother   . Depression Father        Major depression  . Kidney Stones Father     Social History   Tobacco Use  . Smoking status: Former Smoker    Quit  date: 01/19/1995    Years since quitting: 24.9  . Smokeless tobacco: Never Used  Vaping Use  . Vaping Use: Never used  Substance Use Topics  . Alcohol use: Yes    Comment: occasionally  . Drug use: No    Home Medications Prior to Admission medications   Medication Sig Start Date End Date Taking? Authorizing Provider  levocetirizine (XYZAL) 5 MG tablet Take 5 mg by mouth daily.    [provider]  meloxicam (MOBIC) 15 MG tablet Take 1 tablet (15 mg total) by mouth daily. Take with food for 7 days, then as needed. 09/24/17   Lovenia Kim, MD  Potassium 99 MG TABS Take by mouth.    [provider]  sertraline (ZOLOFT) 100 MG tablet Take 100 mg by mouth daily.    [provider]    Allergies    Ambien [zolpidem] and Doxycycline  Review of Systems   Review of Systems  Constitutional: Negative for chills and fever.  HENT: Negative for congestion, rhinorrhea and sore throat.   Eyes: Negative for visual disturbance.  Respiratory: Negative for cough and shortness of breath.   Cardiovascular: Negative for chest pain and leg swelling.  Gastrointestinal: Negative for abdominal  pain, diarrhea, nausea and vomiting.  Genitourinary: Negative for dysuria.  Musculoskeletal: Negative for back pain and neck pain.  Skin: Positive for wound. Negative for rash.  Neurological: Negative for dizziness, light-headedness and headaches.  Hematological: Does not bruise/bleed easily.  Psychiatric/Behavioral: Negative for confusion.    Physical Exam Updated Vital Signs BP (!) 123/97 (BP Location: Right Arm)   Pulse 67   Temp 98 F (36.7 C) (Oral)   Resp 16   Ht 1.727 m (5\' 8" )   Wt 72.6 kg   SpO2 99%   BMI 24.33 kg/m   Physical Exam Vitals and nursing note reviewed.  Constitutional:      General: She is not in acute distress.    Appearance: Normal appearance. She is well-developed.  HENT:     Head: Normocephalic.     Comments: Right posterior occipital area with a  3 cm laceration.  Linear in nature.  Bleeding controlled. Eyes:     Conjunctiva/sclera: Conjunctivae normal.     Pupils: Pupils are equal, round, and reactive to light.  Neck:     Comments: No posterior neck tenderness to palpation Cardiovascular:     Rate and Rhythm: Normal rate and regular rhythm.     Heart sounds: No murmur heard.   Pulmonary:     Effort: Pulmonary effort is normal. No respiratory distress.     Breath sounds: Normal breath sounds.  Abdominal:     Palpations: Abdomen is soft.     Tenderness: There is no abdominal tenderness.  Musculoskeletal:        General: No signs of injury. Normal range of motion.     Cervical back: Normal range of motion and neck supple. No rigidity or tenderness.  Skin:    General: Skin is warm and dry.     Capillary Refill: Capillary refill takes less than 2 seconds.  Neurological:     General: No focal deficit present.     Mental Status: She is alert and oriented to person, place, and time.     Cranial Nerves: No cranial nerve deficit.     Sensory: No sensory deficit.     Motor: No weakness.     Coordination: Coordination normal.     ED Results / Procedures / Treatments   Labs (all labs ordered are listed, but only abnormal results are displayed) Labs Reviewed - No data to display  EKG None  Radiology No results found.  Procedures .Marland KitchenLaceration Repair  Date/Time: 12/22/2019 9:30 AM Performed by: Fredia Sorrow, MD Authorized by: Fredia Sorrow, MD   Consent:    Consent obtained:  Verbal   Consent given by:  Patient   Risks discussed:  Pain and infection   Alternatives discussed:  No treatment Anesthesia (see MAR for exact dosages):    Anesthesia method:  None Laceration details:    Location:  Scalp   Scalp location:  Occipital   Length (cm):  3 Repair type:    Repair type:  Simple Pre-procedure details:    Preparation:  Patient was prepped and draped in usual sterile fashion Exploration:    Contaminated:  no   Treatment:    Area cleansed with:  Shur-Clens   Amount of cleaning:  Standard   Visualized foreign bodies/material removed: no   Skin repair:    Repair method:  Staples   Number of staples:  2 Approximation:    Approximation:  Loose   (including critical care time)  Medications Ordered in ED Medications - No data to display  ED Course  I have reviewed the triage vital signs and the nursing notes.  Pertinent labs & imaging results that were available during my care of the patient were reviewed by me and considered in my medical decision making (see chart for details).    MDM Rules/Calculators/A&P                          Wound repaired after irrigation with 2 surgical staples.  Patient without evidence of any other injuries.  No evidence of concussion patient without any symptoms indicating head CT required.  Patient not on blood thinners   Final Clinical Impression(s) / ED Diagnoses Final diagnoses:  Laceration of scalp, initial encounter    Rx / DC Orders ED Discharge Orders    None       Fredia Sorrow, MD 12/22/19 [phone]

## 2019-12-22 NOTE — ED Triage Notes (Signed)
Pt reports she tripped and fell at work hitting her head on a piece of wood. Pt denies LOC or blood thinners. Unsure of last tetanus shot. Small laceration noted to the back of head.

## 2020-01-08 ENCOUNTER — Encounter (HOSPITAL_BASED_OUTPATIENT_CLINIC_OR_DEPARTMENT_OTHER): Payer: Commercial Managed Care - PPO

## 2020-01-08 ENCOUNTER — Encounter (HOSPITAL_BASED_OUTPATIENT_CLINIC_OR_DEPARTMENT_OTHER): Payer: Self-pay

## 2020-10-09 ENCOUNTER — Other Ambulatory Visit (HOSPITAL_BASED_OUTPATIENT_CLINIC_OR_DEPARTMENT_OTHER): Payer: Self-pay | Admitting: Family Medicine

## 2020-10-09 DIAGNOSIS — Z1231 Encounter for screening mammogram for malignant neoplasm of breast: Secondary | ICD-10-CM

## 2020-10-29 ENCOUNTER — Ambulatory Visit (HOSPITAL_BASED_OUTPATIENT_CLINIC_OR_DEPARTMENT_OTHER)
Admission: RE | Admit: 2020-10-29 | Discharge: 2020-10-29 | Disposition: A | Discharge status: Home / Self Care | Payer: Commercial Managed Care - PPO | Source: Ambulatory Visit | Attending: Family Medicine | Admitting: Family Medicine

## 2020-10-29 ENCOUNTER — Encounter (HOSPITAL_BASED_OUTPATIENT_CLINIC_OR_DEPARTMENT_OTHER): Payer: Self-pay

## 2020-10-29 ENCOUNTER — Other Ambulatory Visit: Payer: Self-pay

## 2020-10-29 DIAGNOSIS — Z1231 Encounter for screening mammogram for malignant neoplasm of breast: Secondary | ICD-10-CM | POA: Insufficient documentation

## 2021-11-04 ENCOUNTER — Other Ambulatory Visit: Payer: Self-pay | Admitting: Family Medicine

## 2021-11-04 DIAGNOSIS — Z1231 Encounter for screening mammogram for malignant neoplasm of breast: Secondary | ICD-10-CM

## 2021-12-18 ENCOUNTER — Ambulatory Visit
Admission: RE | Admit: 2021-12-18 | Discharge: 2021-12-18 | Disposition: A | Discharge status: Home / Self Care | Payer: Commercial Managed Care - PPO | Source: Ambulatory Visit | Attending: Family Medicine | Admitting: Family Medicine

## 2021-12-18 DIAGNOSIS — Z1231 Encounter for screening mammogram for malignant neoplasm of breast: Secondary | ICD-10-CM

## 2022-06-11 ENCOUNTER — Ambulatory Visit (INDEPENDENT_AMBULATORY_CARE_PROVIDER_SITE_OTHER): Payer: BLUE CROSS/BLUE SHIELD

## 2022-06-11 ENCOUNTER — Encounter: Payer: Self-pay | Admitting: Podiatry

## 2022-06-11 ENCOUNTER — Ambulatory Visit: Payer: BLUE CROSS/BLUE SHIELD | Admitting: Podiatry

## 2022-06-11 DIAGNOSIS — M79672 Pain in left foot: Secondary | ICD-10-CM | POA: Diagnosis not present

## 2022-06-11 DIAGNOSIS — M84375A Stress fracture, left foot, initial encounter for fracture: Secondary | ICD-10-CM | POA: Diagnosis not present

## 2022-06-11 NOTE — Progress Notes (Signed)
Subjective:   Patient ID: Tamara Williamson, female   DOB: 57 y.o.   MRN: [id number]   HPI Patient presents stating that she has a lot of pain in the top of her left foot around the third metatarsal and it started 3 weeks ago after doing a 5K.  States that is been very sore and hard to walk on and was given prescription for diclofenac.  Patient does not smoke likes to be active   Review of Systems  All other systems reviewed and are negative.       Objective:  Physical Exam Vitals and nursing note reviewed.  Constitutional:      Appearance: She is well-developed.  Pulmonary:     Effort: Pulmonary effort is normal.  Musculoskeletal:        General: Normal range of motion.  Skin:    General: Skin is warm.  Neurological:     Mental Status: She is alert.     Neurovascular status intact muscle strength found to be adequate range of motion adequate with patient found to have significant swelling edema dorsal left foot that is mostly centered around the second and third metatarsal with the third metatarsal distal shaft at the neck being exquisitely tender with pressure     Assessment:  Probability for fracture of the third metatarsal of a stress nature     Plan:  H&P the x-rays reviewed discussed fracture and at this point patient will go into a air fracture walker which was fitted to her lower leg to immobilize completely and ice will be administered along with continued diclofenac.  Should heal uneventfully reappoint 4 weeks  X-rays indicate suspicious signs of a fracture of the neck of the third metatarsal left foot

## 2022-06-19 ENCOUNTER — Other Ambulatory Visit: Payer: Self-pay | Admitting: Podiatry

## 2022-06-19 DIAGNOSIS — M79672 Pain in left foot: Secondary | ICD-10-CM

## 2022-06-19 DIAGNOSIS — M84375A Stress fracture, left foot, initial encounter for fracture: Secondary | ICD-10-CM

## 2022-07-09 ENCOUNTER — Ambulatory Visit: Payer: Commercial Managed Care - PPO | Admitting: Podiatry

## 2022-07-09 ENCOUNTER — Encounter: Payer: Self-pay | Admitting: Podiatry

## 2022-07-09 ENCOUNTER — Ambulatory Visit (INDEPENDENT_AMBULATORY_CARE_PROVIDER_SITE_OTHER): Payer: BLUE CROSS/BLUE SHIELD

## 2022-07-09 DIAGNOSIS — M84375D Stress fracture, left foot, subsequent encounter for fracture with routine healing: Secondary | ICD-10-CM | POA: Diagnosis not present

## 2022-07-09 DIAGNOSIS — M84375A Stress fracture, left foot, initial encounter for fracture: Secondary | ICD-10-CM

## 2022-07-10 NOTE — Progress Notes (Signed)
Subjective:   Patient ID: Tamara Williamson, female   DOB: 57 y.o.   MRN: [id number]   HPI Patient states I am improving.  States that the left foot is still swelling but she is doing better and has been able to get out of the boot over the last week   ROS      Objective:  Physical Exam  Neurovascular status intact muscle strength found to be adequate patient has forefoot discomfort left around the third metatarsal which does appear to be improving but still painful with pressure      Assessment:  Stress fracture probable left that was just appearing at the last visit with immobilization helping to reduce the discomfort     Plan:  H&P reviewed condition recommended continued ice therapy rigid bottom shoes and boot as needed  X-rays indicated that there is a healing fracture of the neck of the third metatarsal left with multiple signs of healing at this time occurring

## 2023-11-04 ENCOUNTER — Other Ambulatory Visit: Payer: Self-pay | Admitting: Family Medicine

## 2023-11-04 DIAGNOSIS — Z1231 Encounter for screening mammogram for malignant neoplasm of breast: Secondary | ICD-10-CM

## 2023-11-15 ENCOUNTER — Ambulatory Visit
Admission: RE | Admit: 2023-11-15 | Discharge: 2023-11-15 | Disposition: A | Discharge status: Home / Self Care | Source: Ambulatory Visit

## 2023-11-15 DIAGNOSIS — Z1231 Encounter for screening mammogram for malignant neoplasm of breast: Secondary | ICD-10-CM
